# Patient Record
Sex: Male | Born: 1975
Health system: Southern US, Community
[De-identification: ages and names within clinical notes are randomized; demographics above are authoritative.]

## PROBLEM LIST (undated history)

## (undated) DIAGNOSIS — A86 Unspecified viral encephalitis: Secondary | ICD-10-CM

## (undated) DIAGNOSIS — Z87442 Personal history of urinary calculi: Secondary | ICD-10-CM

## (undated) DIAGNOSIS — B019 Varicella without complication: Secondary | ICD-10-CM

## (undated) DIAGNOSIS — N200 Calculus of kidney: Secondary | ICD-10-CM

## (undated) HISTORY — DX: Calculus of kidney: N20.0

## (undated) HISTORY — PX: WISDOM TOOTH EXTRACTION: SHX21

## (undated) HISTORY — PX: VASECTOMY: SHX75

## (undated) HISTORY — DX: Unspecified viral encephalitis: A86

## (undated) HISTORY — DX: Varicella without complication: B01.9

---

## 2009-02-13 ENCOUNTER — Encounter: Admission: RE | Admit: 2009-02-13 | Discharge: 2009-02-13 | Payer: Self-pay | Admitting: Internal Medicine

## 2009-11-20 ENCOUNTER — Emergency Department (HOSPITAL_COMMUNITY): Admission: EM | Admit: 2009-11-20 | Discharge: 2009-11-21 | Payer: Self-pay | Admitting: Emergency Medicine

## 2009-12-01 LAB — HM HIV SCREENING LAB: HM HIV SCREENING: NEGATIVE

## 2010-06-08 ENCOUNTER — Emergency Department: Payer: Self-pay | Admitting: Emergency Medicine

## 2010-06-14 ENCOUNTER — Ambulatory Visit: Payer: Self-pay | Admitting: Urology

## 2010-06-28 ENCOUNTER — Ambulatory Visit: Payer: Self-pay | Admitting: Urology

## 2010-07-26 ENCOUNTER — Ambulatory Visit: Payer: Self-pay | Admitting: Urology

## 2010-07-27 ENCOUNTER — Ambulatory Visit: Payer: Self-pay | Admitting: Urology

## 2010-10-16 LAB — CBC
Hemoglobin: 14.8 g/dL (ref 13.0–17.0)
RBC: 4.71 MIL/uL (ref 4.22–5.81)

## 2010-10-16 LAB — BASIC METABOLIC PANEL
BUN: 10 mg/dL (ref 6–23)
CO2: 29 mEq/L (ref 19–32)
Chloride: 104 mEq/L (ref 96–112)
GFR calc Af Amer: >60 mL/min (ref >60)
GFR calc non Af Amer: >60 mL/min (ref >60)
Glucose, Bld: 150 mg/dL — ABNORMAL HIGH (ref 70–99)
Potassium: 3.2 mEq/L — ABNORMAL LOW (ref 3.5–5.1)
Sodium: 140 mEq/L (ref 135–145)

## 2010-10-16 LAB — RAPID URINE DRUG SCREEN, HOSP PERFORMED
Barbiturates: NOT DETECTED
Cocaine: NOT DETECTED
Opiates: NOT DETECTED

## 2010-10-16 LAB — DIFFERENTIAL
Basophils Absolute: 0.1 10*3/uL (ref 0.0–0.1)
Basophils Relative: 1 % (ref 0–1)
Eosinophils Absolute: 0 10*3/uL (ref 0.0–0.7)
Eosinophils Relative: 0 % (ref 0–5)
Lymphocytes Relative: 13 % (ref 12–46)
Lymphs Abs: 1.5 10*3/uL (ref 0.7–4.0)
Monocytes Absolute: 0.5 10*3/uL (ref 0.1–1.0)
Monocytes Relative: 4 % (ref 3–12)
Neutrophils Relative %: 82 % — ABNORMAL HIGH (ref 43–77)

## 2010-10-16 LAB — GLUCOSE, CAPILLARY: Glucose-Capillary: 138 mg/dL — ABNORMAL HIGH (ref 70–99)

## 2010-10-16 LAB — URINALYSIS, ROUTINE W REFLEX MICROSCOPIC
Ketones, ur: 40 mg/dL — AB
Nitrite: NEGATIVE
Urobilinogen, UA: 1 mg/dL (ref 0.0–1.0)

## 2010-10-16 LAB — CK TOTAL AND CKMB (NOT AT ARMC): CK, MB: 1.4 ng/mL (ref 0.3–4.0)

## 2010-10-16 LAB — TROPONIN I: Troponin I: 0.01 ng/mL (ref 0.00–0.06)

## 2011-07-30 HISTORY — PX: EXTRACORPOREAL SHOCK WAVE LITHOTRIPSY: SHX1557

## 2011-08-23 IMAGING — CR DG ABDOMEN 1V
1 series · 2 of 2 positions shown · non-contrast
Comparison: none

REASON FOR EXAM: Calculus
COMMENTS:

[Series 1: view not recorded · 0.17mm/px · 2 of 2 slices shown]
[im 1/2]
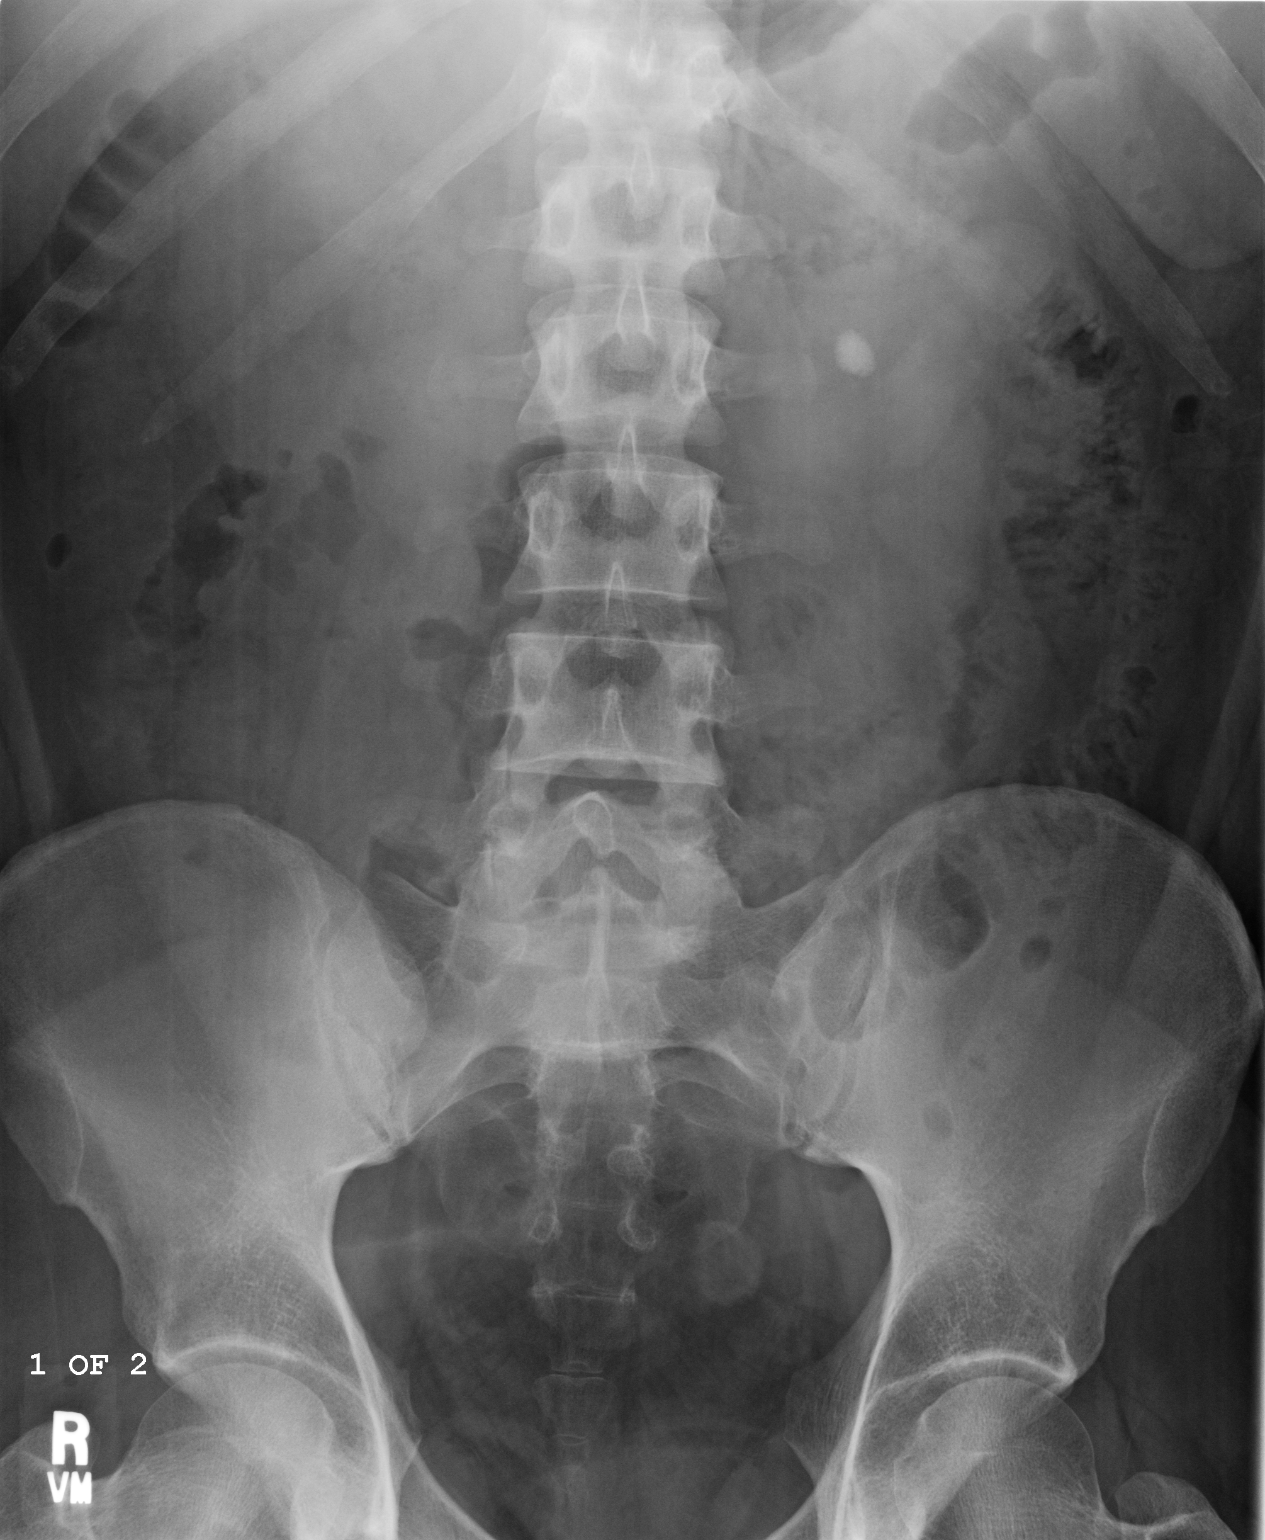
[im 2/2]
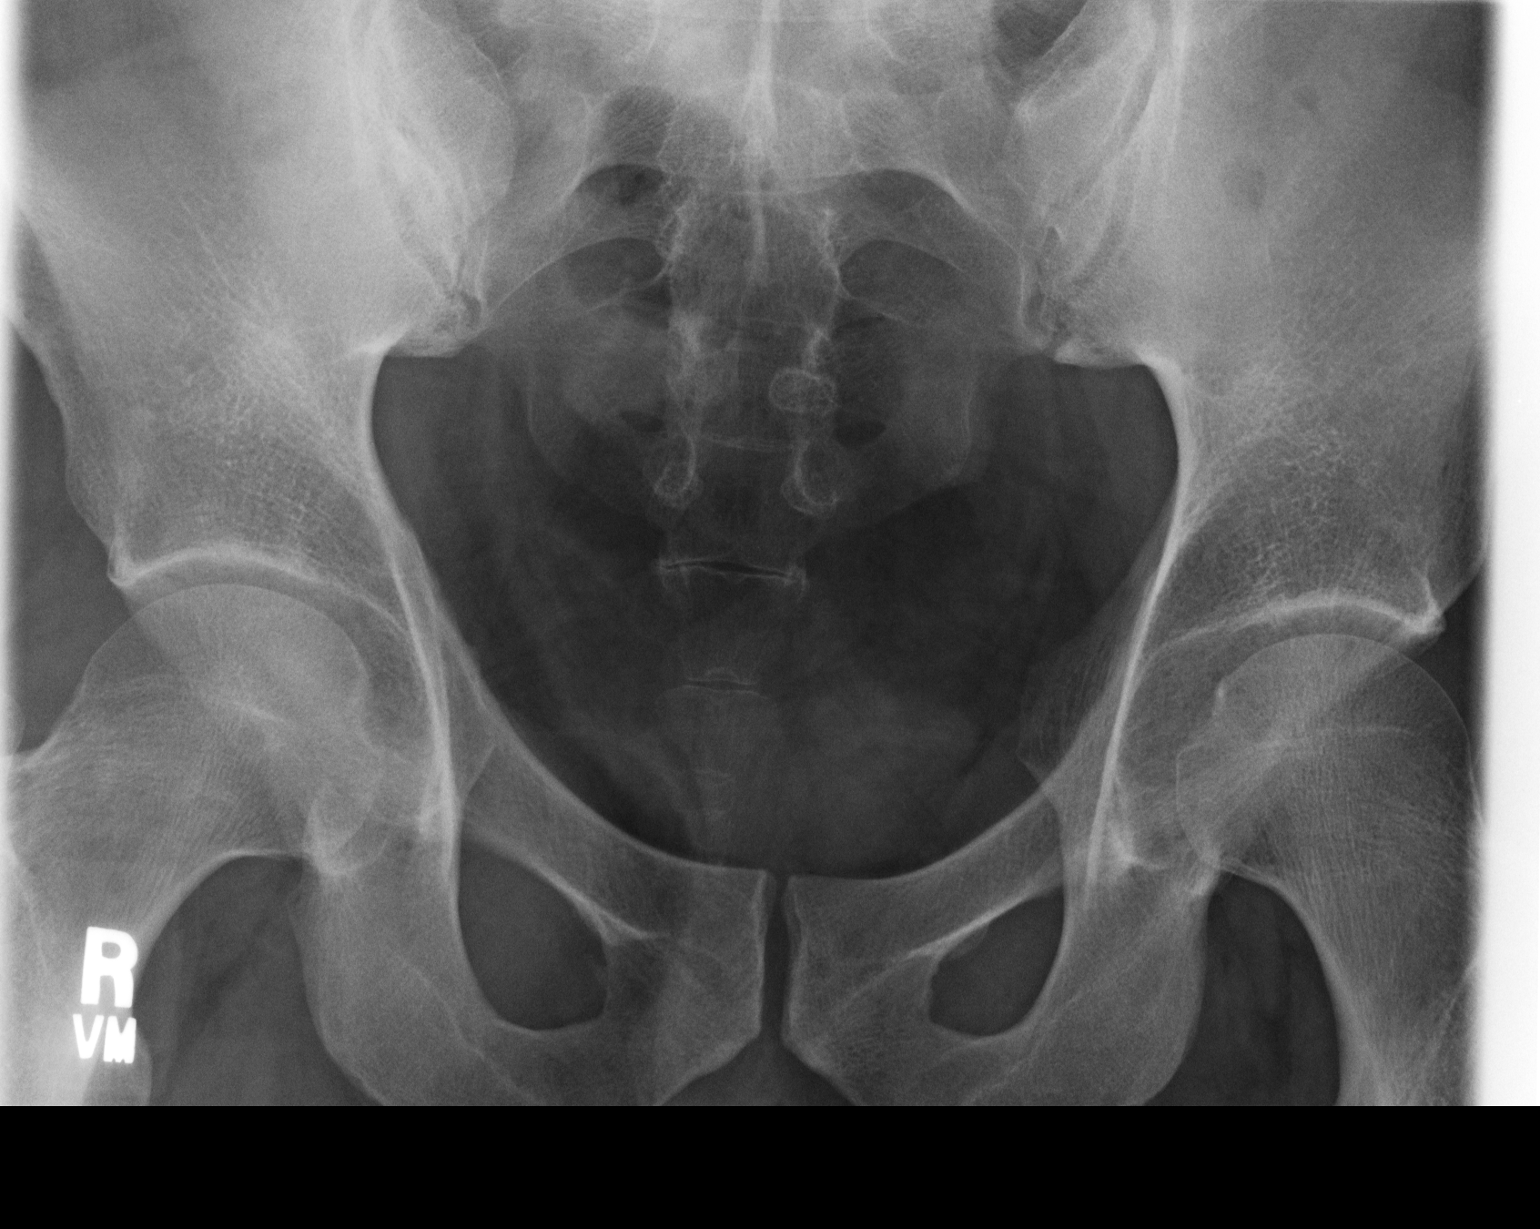

[2 of 2 positions shown; findings below may reference images not displayed]

PROCEDURE:     DXR - DXR KIDNEY URETER BLADDER  - June 28, 2010  [DATE]

RESULT:     Comparison is made to the study of 06/14/2010. There is a large
calcific density in the region of the left kidney measuring 12 mm which is
similar to the previous exam. This is at the L2 level over the region of the
left renal pelvis. No definite right nephrolithiasis is evident. No definite
distal ureteral calculi are evident. The bowel gas pattern is unremarkable.
IMPRESSION: Left nephrolithiasis.

## 2014-01-12 ENCOUNTER — Emergency Department: Payer: Self-pay | Admitting: Emergency Medicine

## 2014-01-12 LAB — URINALYSIS, COMPLETE
Bilirubin,UR: NEGATIVE
Glucose,UR: NEGATIVE mg/dL (ref 0–75)
KETONE: NEGATIVE
Leukocyte Esterase: NEGATIVE
NITRITE: NEGATIVE
Ph: 5 (ref 4.5–8.0)
RBC,UR: 49 /HPF (ref 0–5)
SPECIFIC GRAVITY: 1.024 (ref 1.003–1.030)
SQUAMOUS EPITHELIAL: NONE SEEN
WBC UR: 1 /HPF (ref 0–5)

## 2014-01-12 LAB — BASIC METABOLIC PANEL
Anion Gap: 8 (ref 7–16)
BUN: 8 mg/dL (ref 7–18)
CALCIUM: 9.2 mg/dL (ref 8.5–10.1)
CREATININE: 1.34 mg/dL — AB (ref 0.60–1.30)
Chloride: 105 mmol/L (ref 98–107)
Co2: 26 mmol/L (ref 21–32)
EGFR (African American): >60
EGFR (Non-African Amer.): >60
GLUCOSE: 133 mg/dL — AB (ref 65–99)
OSMOLALITY: 278 (ref 275–301)
POTASSIUM: 3.9 mmol/L (ref 3.5–5.1)
Sodium: 139 mmol/L (ref 136–145)

## 2015-05-26 ENCOUNTER — Ambulatory Visit: Payer: Self-pay

## 2015-06-02 ENCOUNTER — Ambulatory Visit (INDEPENDENT_AMBULATORY_CARE_PROVIDER_SITE_OTHER): Payer: 59 | Admitting: Urology

## 2015-06-02 ENCOUNTER — Encounter: Payer: Self-pay | Admitting: Urology

## 2015-06-02 VITALS — BP 146/80 | HR 68 | Ht 72.0 in | Wt 235.1 lb

## 2015-06-02 DIAGNOSIS — Z3009 Encounter for other general counseling and advice on contraception: Secondary | ICD-10-CM | POA: Diagnosis not present

## 2015-06-02 MED ORDER — DIAZEPAM 10 MG PO TABS: 10.0000 mg | ORAL_TABLET | ORAL | Status: DC

## 2015-06-02 NOTE — Progress Notes (Signed)
06/02/2015 10:21 AM   Adam Barron 1976/01/25 [id number]  Referring provider: No referring provider defined for this encounter.  Chief Complaint  Patient presents with  . VAS Consult    New Patient    HPI: The patient is a 39 year old male who presents to the office his wife today to discuss a vasectomy. He has 3 children at this time. He and his wife desire no further children.   PMH: Past Medical History  Diagnosis Date  . Nephrolithiasis     Surgical History: Past Surgical History  Procedure Laterality Date  . Extracorporeal shock wave lithotripsy  2013    Home Medications:    Medication List       This list is accurate as of: 06/02/15 10:21 AM.  Always use your most recent med list.               diazepam 10 MG tablet  Commonly known as:  VALIUM  Take 1 tablet (10 mg total) by mouth as directed.        Allergies:  Allergies  Allergen Reactions  . Penicillin G Rash    Family History: Family History  Problem Relation Age of Onset  . Bladder Cancer Neg Hx   . Prostate cancer Neg Hx   . Kidney Stones Father     Social History:  reports that he has quit smoking. He does not have any smokeless tobacco history on file. He reports that he drinks alcohol. He reports that he does not use illicit drugs.  ROS: UROLOGY Frequent Urination?: No Hard to postpone urination?: No Burning/pain with urination?: No Get up at night to urinate?: No Leakage of urine?: No Urine stream starts and stops?: No Trouble starting stream?: No Do you have to strain to urinate?: No Blood in urine?: No Urinary tract infection?: No Sexually transmitted disease?: No Injury to kidneys or bladder?: No Painful intercourse?: No Weak stream?: No Erection problems?: No Penile pain?: No  Gastrointestinal Nausea?: No Vomiting?: No Indigestion/heartburn?: No Diarrhea?: No Constipation?: No  Constitutional Fever: No Night sweats?: No Weight loss?: No Fatigue?:  No  Skin Skin rash/lesions?: No Itching?: No  Eyes Blurred vision?: No Double vision?: No  Ears/Nose/Throat Sore throat?: No Sinus problems?: No  Hematologic/Lymphatic Swollen glands?: No Easy bruising?: No  Cardiovascular Leg swelling?: No Chest pain?: No  Respiratory Cough?: No Shortness of breath?: No  Endocrine Excessive thirst?: No  Musculoskeletal Back pain?: No Joint pain?: No  Neurological Headaches?: No Dizziness?: No  Psychologic Depression?: No Anxiety?: No  Physical Exam: BP 146/80 mmHg  Pulse 68  Ht 6' (1.829 m)  Wt 235 lb 1.6 oz (106.641 kg)  BMI 31.88 kg/m2  Constitutional:  Alert and oriented, No acute distress. HEENT: [place] AT, moist mucus membranes.  Trachea midline, no masses. Cardiovascular: No clubbing, cyanosis, or edema. Respiratory: Normal respiratory effort, no increased work of breathing. GI: Abdomen is soft, nontender, nondistended, no abdominal masses GU: No CVA tenderness. Normal phallus. Testicles and equal bilaterally. The vas is palpable bilaterally. Skin: No rashes, bruises or suspicious lesions. Lymph: No cervical or inguinal adenopathy. Neurologic: Grossly intact, no focal deficits, moving all 4 extremities. Psychiatric: Normal mood and affect.  Laboratory Data: Lab Results  Component Value Date   WBC 11.8* 11/20/2009   HGB 14.8 11/20/2009   HCT 42.9 11/20/2009   MCV 91.1 11/20/2009   PLT 223 11/20/2009    Lab Results  Component Value Date   CREATININE 1.34* 01/12/2014    No results  found for: PSA  No results found for: TESTOSTERONE  No results found for: HGBA1C  Urinalysis    Component Value Date/Time   COLORURINE Yellow 01/12/2014 0902   COLORURINE AMBER BIOCHEMICALS MAY BE AFFECTED BY COLOR* 11/20/2009 2053   APPEARANCEUR Hazy 01/12/2014 0902   APPEARANCEUR TURBID* 11/20/2009 2053   LABSPEC 1.024 01/12/2014 0902   LABSPEC 1.027 11/20/2009 2053   PHURINE 5.0 01/12/2014 0902   PHURINE 6.0  11/20/2009 2053   GLUCOSEU Negative 01/12/2014 0902   GLUCOSEU NEGATIVE 11/20/2009 2053   HGBUR 2+ 01/12/2014 0902   HGBUR LARGE* 11/20/2009 2053   BILIRUBINUR Negative 01/12/2014 0902   BILIRUBINUR SMALL* 11/20/2009 2053   KETONESUR Negative 01/12/2014 0902   KETONESUR 40* 11/20/2009 2053   PROTEINUR 30 mg/dL 16/10/960406/17/2015 [id number]   PROTEINUR 100* 11/20/2009 2053   UROBILINOGEN 1.0 11/20/2009 2053   NITRITE Negative 01/12/2014 0902   NITRITE NEGATIVE 11/20/2009 2053   LEUKOCYTESUR Negative 01/12/2014 0902   LEUKOCYTESUR SMALL* 11/20/2009 2053      Assessment & Plan:    Today, we discussed what the vas deferens is, where it is located, and its function. We reviewed the procedure for vasectomy, it's risks, benefits, alternatives, and likelihood of achieving his goals. We discussed in detail the procedure, complications, and recovery as well as the need for clearance prior to unprotected intercourse. We discussed that this procedure does not result in immediate sterility and that they would need to use other forms of birth control until he has been cleared with negative postvasectomy semen analyses. I explained that the procedure is considered to be permanent and that attempts at reversal have varying degrees of success. These options include vasectomy reversal, sperm retrieval, and in vitro fertilization; these can be very expensive. We discussed the chance of postvasectomy pain syndrome which occurs in less than 5% of patients. I explained to the patient that there is no treatment to resolve this chronic pain, and that if it developed I would not be able to help resolve the issue, but that surgery is generally not needed for correction. I explained there have even been reports of systemic like illness associated with this chronic pain, and that there was no good cure. I explained that vasectomy it is not a 100% reliable form of birth control, and the risk of pregnancy after vasectomy is approximately  1 in 2000 men who had a negative postvasectomy semen analysis or rare non-motile sperm. I explained that repeat vasectomy was necessary in less than 1% of vasectomy procedures when employing the type of technique that I use. I explained that he should refrain from ejaculation for approximately one week following vasectomy. I explained that there are other options for birth control which are permanent and non-permanent; we discussed these. I explained the rates of surgical complications, such as symptomatic hematoma or infection, are low (1-2%) and vary with the surgeon's experience and criteria used to diagnose the complication.  The patient had the opportunity to ask questions to his stated satisfaction. He voiced understanding of the above factors and stated that he has read all the information provided to him and the packets and informed consent   1. Family planning -vasectomy   Return for Vasectomy.  Hildred LaserBrian James Gloria Lambertson, MD  [facility] [address], [address] [place], [place] [address] [phone]

## 2015-06-29 ENCOUNTER — Ambulatory Visit (INDEPENDENT_AMBULATORY_CARE_PROVIDER_SITE_OTHER): Payer: 59 | Admitting: Urology

## 2015-06-29 ENCOUNTER — Encounter: Payer: Self-pay | Admitting: Urology

## 2015-06-29 VITALS — BP 143/83 | HR 65 | Ht 72.0 in | Wt 232.6 lb

## 2015-06-29 DIAGNOSIS — Z3009 Encounter for other general counseling and advice on contraception: Secondary | ICD-10-CM | POA: Diagnosis not present

## 2015-06-29 NOTE — Progress Notes (Signed)
Bilateral Vasectomy Procedure  Pre-Procedure: - Patient's scrotum was prepped and draped for vasectomy. - The vas was palpated through the scrotal skin on the left. - 1% Xylocaine was injected into the skin and surrounding tissue for placement  - In a similar manner, the vas on the right was identified, anesthetized, and stabilized.  Procedure: - A scalpel was used to make a 1 cm incision in the left hemiscrotum - The left vas was isolated and brought up through the incision exposing that structure. - Bleeding points were cauterized as they occurred. - The vas was free from the surrounding structures and brought to the view. - A segment was positioned for placement with a hemostat. - A second hemostat was placed and a small segment between the two hemostats and was removed for inspection. - Each end of the transected vas lumen was fulgurated/ obliterated using needlepoint electrocautery - Each end of the last was tied off with a interrupted 3-0 silk. -The same procedure was performed on the right. - A single suture of #3-0 chromic catgut was used to close each lateral scrotal skin incision - A dressing was applied.  Post-Procedure: - Patient was instructed in care of the operative area - A specimen is to be delivered in 12 weeks   -Another form of contraception is to be used until

## 2015-07-05 ENCOUNTER — Telehealth: Payer: Self-pay

## 2015-07-05 NOTE — Telephone Encounter (Signed)
Pt called c/o pain and increased swelling of testicles. Pt stated he has been doing some heavy lifting over the weekend and into Monday. Pt denied n/v, f/c, site drainage, increased temperature at site, or dehiscence. Offered pt for provider to examine site and pt declined. Reinforced with pt to rest, apply ice, and monitor swelling/redness. Pt voiced understanding.

## 2015-08-16 ENCOUNTER — Ambulatory Visit (INDEPENDENT_AMBULATORY_CARE_PROVIDER_SITE_OTHER): Payer: 59 | Admitting: Obstetrics and Gynecology

## 2015-08-16 ENCOUNTER — Encounter: Payer: Self-pay | Admitting: Obstetrics and Gynecology

## 2015-08-16 VITALS — BP 151/90 | HR 86 | Resp 16 | Ht 72.0 in | Wt 236.2 lb

## 2015-08-16 DIAGNOSIS — N5082 Scrotal pain: Secondary | ICD-10-CM | POA: Diagnosis not present

## 2015-08-16 NOTE — Progress Notes (Signed)
08/16/2015 2:48 PM   [name] January 17, 1976 [id number]  Referring provider: No referring provider defined for this encounter.  Chief Complaint  Patient presents with  . Post-op Problem    vasectomy 06/29/15  . Testicle Pain    HPI: Patient is a 40 year old male status post vasectomy 6 weeks ago by Dr. Sherryl Barters. He presents today with complaints of new onset of scrotal swelling occurring after he felt a popping sensation 4 days ago in the right side of his scrotum. He reports that swelling was significant for few days and has since began to improve. He denies any fevers or drainage from scrotum.   PMH: Past Medical History  Diagnosis Date  . Nephrolithiasis     Surgical History: Past Surgical History  Procedure Laterality Date  . Extracorporeal shock wave lithotripsy  2013    Home Medications:    Medication List    Notice  As of 08/16/2015  2:48 PM   You have not been prescribed any medications.      Allergies:  Allergies  Allergen Reactions  . Penicillin G Rash    Family History: Family History  Problem Relation Age of Onset  . Bladder Cancer Neg Hx   . Prostate cancer Neg Hx   . Kidney Stones Father     Social History:  reports that he has quit smoking. He does not have any smokeless tobacco history on file. He reports that he drinks alcohol. He reports that he does not use illicit drugs.  ROS: UROLOGY Frequent Urination?: No Hard to postpone urination?: No Burning/pain with urination?: No Get up at night to urinate?: No Leakage of urine?: No Urine stream starts and stops?: No Trouble starting stream?: No Do you have to strain to urinate?: No Blood in urine?: No Urinary tract infection?: No Sexually transmitted disease?: No Injury to kidneys or bladder?: No Painful intercourse?: No Weak stream?: No Erection problems?: No Penile pain?: No  Gastrointestinal Nausea?: No Vomiting?: No Indigestion/heartburn?: No Diarrhea?: No Constipation?:  No  Constitutional Fever: No Night sweats?: No Weight loss?: No Fatigue?: No  Skin Skin rash/lesions?: No Itching?: No  Eyes Blurred vision?: No Double vision?: No  Ears/Nose/Throat Sore throat?: No Sinus problems?: No  Hematologic/Lymphatic Swollen glands?: No Easy bruising?: No  Cardiovascular Leg swelling?: No Chest pain?: No  Respiratory Cough?: No Shortness of breath?: No  Endocrine Excessive thirst?: No  Musculoskeletal Back pain?: No Joint pain?: No  Neurological Headaches?: No Dizziness?: No  Psychologic Depression?: No Anxiety?: No  Physical Exam: BP 151/90 mmHg  Pulse 86  Resp 16  Ht 6' (1.829 m)  Wt 236 lb 3.2 oz (107.14 kg)  BMI 32.03 kg/m2  Constitutional:  Alert and oriented, No acute distress. GU: Normal circumcised phallus, bilateral scrotal swelling right greater than left noted, palpable mobile fluctuant moderately tender area noted superior to this patient's right testicle, testicles bilaterally normal, no erythema or drainage Neurologic: Grossly intact, no focal deficits, moving all 4 extremities. Psychiatric: Normal mood and affect.  Laboratory Data:   Urinalysis    Component Value Date/Time   COLORURINE Yellow 01/12/2014 0902   COLORURINE AMBER BIOCHEMICALS MAY BE AFFECTED BY COLOR* 11/20/2009 2053   APPEARANCEUR Hazy 01/12/2014 0902   APPEARANCEUR TURBID* 11/20/2009 2053   LABSPEC 1.024 01/12/2014 0902   LABSPEC 1.027 11/20/2009 2053   PHURINE 5.0 01/12/2014 0902   PHURINE 6.0 11/20/2009 2053   GLUCOSEU Negative 01/12/2014 0902   GLUCOSEU NEGATIVE 11/20/2009 2053   HGBUR 2+ 01/12/2014 0902  HGBUR LARGE* 11/20/2009 2053   BILIRUBINUR Negative 01/12/2014 0902   BILIRUBINUR SMALL* 11/20/2009 2053   KETONESUR Negative 01/12/2014 0902   KETONESUR 40* 11/20/2009 2053   PROTEINUR 30 mg/dL 14/78/2956 2130   PROTEINUR 100* 11/20/2009 2053   UROBILINOGEN 1.0 11/20/2009 2053   NITRITE Negative 01/12/2014 0902   NITRITE  NEGATIVE 11/20/2009 2053   LEUKOCYTESUR Negative 01/12/2014 0902   LEUKOCYTESUR SMALL* 11/20/2009 2053    Pertinent Imaging:  Assessment & Plan:  1. Scrotal Pain- 6 weeks s/p vasectomy.  Patient was also seen and examined by Dr. Sherryl Barters today. Possible small vessel rupture resulting in hematoma formation. Since swelling is beginning to improve patient was reassured and he will follow-up in 3 weeks to ensure resolution of swelling and discomfort. If symptoms do not improve we will proceed with a scrotal ultrasound.   There are no diagnoses linked to this encounter.  Return in about 3 weeks (around 09/06/2015).  These notes generated with voice recognition software. I apologize for typographical errors.  Earlie Lou, FNP  [facility] [address] [place], [place] [address] [phone]

## 2015-09-06 ENCOUNTER — Ambulatory Visit: Payer: 59 | Admitting: Obstetrics and Gynecology

## 2016-11-15 ENCOUNTER — Emergency Department: Payer: 59

## 2016-11-15 ENCOUNTER — Emergency Department
Admission: EM | Admit: 2016-11-15 | Discharge: 2016-11-15 | Disposition: A | Discharge status: Home / Self Care | Payer: 59 | Attending: Emergency Medicine | Admitting: Emergency Medicine

## 2016-11-15 DIAGNOSIS — F17220 Nicotine dependence, chewing tobacco, uncomplicated: Secondary | ICD-10-CM | POA: Diagnosis not present

## 2016-11-15 DIAGNOSIS — R079 Chest pain, unspecified: Secondary | ICD-10-CM | POA: Diagnosis not present

## 2016-11-15 DIAGNOSIS — R0789 Other chest pain: Secondary | ICD-10-CM | POA: Insufficient documentation

## 2016-11-15 LAB — CBC
HEMATOCRIT: 44.1 % (ref 40.0–52.0)
HEMOGLOBIN: 15.6 g/dL (ref 13.0–18.0)
MCH: 30.2 pg (ref 26.0–34.0)
MCHC: 35.3 g/dL (ref 32.0–36.0)
MCV: 85.6 fL (ref 80.0–100.0)
Platelets: 208 10*3/uL (ref 150–440)
RBC: 5.15 MIL/uL (ref 4.40–5.90)
RDW: 13.1 % (ref 11.5–14.5)
WBC: 7.5 10*3/uL (ref 3.8–10.6)

## 2016-11-15 LAB — BASIC METABOLIC PANEL
ANION GAP: 6 (ref 5–15)
BUN: 9 mg/dL (ref 6–20)
CHLORIDE: 106 mmol/L (ref 101–111)
CO2: 26 mmol/L (ref 22–32)
Calcium: 9.1 mg/dL (ref 8.9–10.3)
Creatinine, Ser: 1.09 mg/dL (ref 0.61–1.24)
GFR calc Af Amer: >60 mL/min (ref >60)
GFR calc non Af Amer: >60 mL/min (ref >60)
GLUCOSE: 101 mg/dL — AB (ref 65–99)
POTASSIUM: 3.9 mmol/L (ref 3.5–5.1)
Sodium: 138 mmol/L (ref 135–145)

## 2016-11-15 LAB — TROPONIN I: Troponin I: <0.03 ng/mL (ref <0.03)

## 2016-11-15 NOTE — Discharge Instructions (Signed)
Please establish care care with a primary care physician within the next week for a recheck.  Return to the ED sooner for any new or worsening symptoms such as worsening chest pain, shortness of breath, or for any other concerns.  It was a pleasure to take care of you today, and thank you for coming to our emergency department.  If you have any questions or concerns before leaving please ask the nurse to grab me and I'm more than happy to go through your aftercare instructions again.  If you were prescribed any opioid pain medication today such as Norco, Vicodin, Percocet, morphine, hydrocodone, or oxycodone please make sure you do not drive when you are taking this medication as it can alter your ability to drive safely.  If you have any concerns once you are home that you are not improving or are in fact getting worse before you can make it to your follow-up appointment, please do not hesitate to call 911 and come back for further evaluation.  Merrily Brittle MD  Results for orders placed or performed during the hospital encounter of 11/15/16  Basic metabolic panel  Result Value Ref Range   Sodium 138 135 - 145 mmol/L   Potassium 3.9 3.5 - 5.1 mmol/L   Chloride 106 101 - 111 mmol/L   CO2 26 22 - 32 mmol/L   Glucose, Bld 101 (H) 65 - 99 mg/dL   BUN 9 6 - 20 mg/dL   Creatinine, Ser 1.61 0.61 - 1.24 mg/dL   Calcium 9.1 8.9 - 09.6 mg/dL   GFR calc non Af Amer >60 >60 mL/min   GFR calc Af Amer >60 >60 mL/min   Anion gap 6 5 - 15  CBC  Result Value Ref Range   WBC 7.5 3.8 - 10.6 K/uL   RBC 5.15 4.40 - 5.90 MIL/uL   Hemoglobin 15.6 13.0 - 18.0 g/dL   HCT 04.5 40.9 - 81.1 %   MCV 85.6 80.0 - 100.0 fL   MCH 30.2 26.0 - 34.0 pg   MCHC 35.3 32.0 - 36.0 g/dL   RDW 91.4 78.2 - 95.6 %   Platelets 208 150 - 440 K/uL  Troponin I  Result Value Ref Range   Troponin I <0.03 <0.03 ng/mL   Dg Chest 2 View  Result Date: 11/15/2016 CLINICAL DATA:  Acute dizziness and chest pain for 1 month EXAM:  CHEST  2 VIEW COMPARISON:  None available next healed FINDINGS: The heart size and mediastinal contours are within normal limits. Both lungs are clear. The visualized skeletal structures are unremarkable. IMPRESSION: No active cardiopulmonary disease. Electronically Signed   By: Judie Petit.  Shick M.D.   On: 11/15/2016 12:07

## 2016-11-15 NOTE — ED Provider Notes (Signed)
[facility] Emergency Department Provider Note  ____________________________________________   First MD Initiated Contact with Patient 11/15/16 1435     (approximate)  I have reviewed the triage vital signs and the nursing notes.   HISTORY  Chief Complaint Chest Pain    HPI Adam Barron is a 41 y.o. male who presents the emergency department with 1 month of intermittent sharp substernal nonradiating chest pain. It is nonexertional. He's had no shortness of breath.He comes to the emergency department today not for any change in his pain but because he had the day off and his wife insisted. He attempted to go into the walk-in clinic but because it was chest pain and advised him to come to the emergency department. He has no family history of early myocardial infarction. He denies fevers or chills. He denies cough. He denies leg swelling or immobilization. His pain is not ripping or tearing and does not go to his back.   Past Medical History:  Diagnosis Date  . Nephrolithiasis     There are no active problems to display for this patient.   Past Surgical History:  Procedure Laterality Date  . EXTRACORPOREAL SHOCK WAVE LITHOTRIPSY  2013    Prior to Admission medications   Not on File    Allergies Penicillin g  Family History  Problem Relation Age of Onset  . Kidney Stones Father   . Bladder Cancer Neg Hx   . Prostate cancer Neg Hx     Social History Social History  Substance Use Topics  . Smoking status: Former Games developer  . Smokeless tobacco: Current User    Types: Chew  . Alcohol use 0.0 oz/week     Comment: socially    Review of Systems Constitutional: No fever/chills Eyes: No visual changes. ENT: No sore throat. Cardiovascular: Positive chest pain. Respiratory: Denies shortness of breath. Gastrointestinal: No abdominal pain.  No nausea, no vomiting.  No diarrhea.  No constipation. Genitourinary: Negative for  dysuria. Musculoskeletal: Negative for back pain. Skin: Negative for rash. Neurological: Negative for headaches, focal weakness or numbness.  10-point ROS otherwise negative.  ____________________________________________   PHYSICAL EXAM:  VITAL SIGNS: ED Triage Vitals  Enc Vitals Group     BP 11/15/16 1134 132/87     Pulse Rate 11/15/16 1134 67     Resp 11/15/16 1134 18     Temp 11/15/16 1134 98.9 F (37.2 C)     Temp Source 11/15/16 1134 Oral     SpO2 11/15/16 1134 98 %     Weight 11/15/16 1135 235 lb (106.6 kg)     Height 11/15/16 1135 6' (1.829 m)     Head Circumference --      Peak Flow --      Pain Score --      Pain Loc --      Pain Edu? --      Excl. in GC? --     Constitutional: Alert and oriented x 4 well appearing nontoxic no diaphoresis speaks in full, clear sentences Eyes: PERRL EOMI. Head: Atraumatic. Nose: No congestion/rhinnorhea. Mouth/Throat: No trismus Neck: No stridor.   Cardiovascular: Normal rate, regular rhythm. Grossly normal heart sounds.  Good peripheral circulation. Respiratory: Normal respiratory effort.  No retractions. Lungs CTAB and moving good air Gastrointestinal: Soft nondistended nontender  Musculoskeletal: No lower extremity edema  Legs are equal in size Neurologic:  Normal speech and language. No gross focal neurologic deficits are appreciated. Skin:  Skin is warm, dry and  intact. No rash noted. Psychiatric: Mood and affect are normal. Speech and behavior are normal.    ______________________________   LABS (all labs ordered are listed, but only abnormal results are displayed)  Labs Reviewed  BASIC METABOLIC PANEL - Abnormal; Notable for the following:       Result Value   Glucose, Bld 101 (*)    All other components within normal limits  CBC  TROPONIN I    No signs of acute ischemia __________________________________________  EKG  ED ECG REPORT I, Merrily Brittle, the attending physician, personally viewed and  interpreted this ECG.  Date: 11/15/2016 Rate: 86 Rhythm: normal sinus rhythm QRS Axis: normal Intervals: normal ST/T Wave abnormalities: normal Conduction Disturbances: none Narrative Interpretation: unremarkable  ____________________________________________  RADIOLOGY  Chest x-ray with no acute disease ____________________________________________   PROCEDURES  Procedure(s) performed: no  Procedures  Critical Care performed: no  ____________________________________________   INITIAL IMPRESSION / ASSESSMENT AND PLAN / ED COURSE  Pertinent labs & imaging results that were available during my care of the patient were reviewed by me and considered in my medical decision making (see chart for details).  The patient arrives atypical chest pain and a heart score of 1. His EKG is unremarkable. His pain is nonexertional. His chest x-ray is unremarkable. At this point he is medically stable for outpatient management with reestablishing care with primary care.      ____________________________________________   FINAL CLINICAL IMPRESSION(S) / ED DIAGNOSES  Final diagnoses:  Atypical chest pain      NEW MEDICATIONS STARTED DURING THIS VISIT:  There are no discharge medications for this patient.    Note:  This document was prepared using Dragon voice recognition software and may include unintentional dictation errors.     Merrily Brittle, MD 11/16/16 1200

## 2016-11-15 NOTE — ED Triage Notes (Signed)
Pt c/o intermittent chest tightness for the past month, states  Yesterday the pain radiated into the left arm and neck.. Denies SOB or diaphoresis with the episodes..states it occurs at rest.

## 2018-02-03 ENCOUNTER — Ambulatory Visit: Payer: 59 | Admitting: Internal Medicine

## 2018-02-03 ENCOUNTER — Encounter: Payer: Self-pay | Admitting: Internal Medicine

## 2018-02-03 VITALS — BP 138/80 | HR 72 | Temp 98.4°F | Ht 72.0 in | Wt 238.4 lb

## 2018-02-03 DIAGNOSIS — E669 Obesity, unspecified: Secondary | ICD-10-CM

## 2018-02-03 DIAGNOSIS — Z86018 Personal history of other benign neoplasm: Secondary | ICD-10-CM

## 2018-02-03 DIAGNOSIS — Z0000 Encounter for general adult medical examination without abnormal findings: Secondary | ICD-10-CM

## 2018-02-03 DIAGNOSIS — R739 Hyperglycemia, unspecified: Secondary | ICD-10-CM

## 2018-02-03 DIAGNOSIS — Z1283 Encounter for screening for malignant neoplasm of skin: Secondary | ICD-10-CM | POA: Diagnosis not present

## 2018-02-03 DIAGNOSIS — E559 Vitamin D deficiency, unspecified: Secondary | ICD-10-CM

## 2018-02-03 DIAGNOSIS — Z13818 Encounter for screening for other digestive system disorders: Secondary | ICD-10-CM

## 2018-02-03 DIAGNOSIS — Z1389 Encounter for screening for other disorder: Secondary | ICD-10-CM

## 2018-02-03 DIAGNOSIS — Z0184 Encounter for antibody response examination: Secondary | ICD-10-CM

## 2018-02-03 DIAGNOSIS — Z1159 Encounter for screening for other viral diseases: Secondary | ICD-10-CM

## 2018-02-03 DIAGNOSIS — Z1329 Encounter for screening for other suspected endocrine disorder: Secondary | ICD-10-CM

## 2018-02-03 DIAGNOSIS — Z13220 Encounter for screening for lipoid disorders: Secondary | ICD-10-CM

## 2018-02-03 NOTE — Progress Notes (Signed)
Pre visit review using our clinic review tool, if applicable. No additional management support is needed unless otherwise documented below in the visit note. 

## 2018-02-03 NOTE — Progress Notes (Signed)
Chief Complaint  Patient presents with  . Establish Care   New patient w/o complaints wants primary doctor    Review of Systems  Constitutional: Negative for weight loss.  HENT: Negative for hearing loss.   Eyes: Negative for blurred vision.  Respiratory: Negative for shortness of breath.   Cardiovascular: Negative for chest pain.  Gastrointestinal: Negative for abdominal pain.  Skin: Negative for rash.  Neurological: Negative for headaches.  Psychiatric/Behavioral: Negative for depression.   Past Medical History:  Diagnosis Date  . Nephrolithiasis    Past Surgical History:  Procedure Laterality Date  . EXTRACORPOREAL SHOCK WAVE LITHOTRIPSY  2013   Family History  Problem Relation Age of Onset  . Kidney Stones Father   . Bladder Cancer Neg Hx   . Prostate cancer Neg Hx    Social History   Socioeconomic History  . Marital status: Married    Spouse name: Not on file  . Number of children: Not on file  . Years of education: Not on file  . Highest education level: Not on file  Occupational History  . Not on file  Social Needs  . Financial resource strain: Not on file  . Food insecurity:    Worry: Not on file    Inability: Not on file  . Transportation needs:    Medical: Not on file    Non-medical: Not on file  Tobacco Use  . Smoking status: Former Games developer  . Smokeless tobacco: Current User    Types: Chew  Substance and Sexual Activity  . Alcohol use: Yes    Alcohol/week: 0.0 oz    Comment: socially  . Drug use: No  . Sexual activity: Not on file  Lifestyle  . Physical activity:    Days per week: Not on file    Minutes per session: Not on file  . Stress: Not on file  Relationships  . Social connections:    Talks on phone: Not on file    Gets together: Not on file    Attends religious service: Not on file    Active member of club or organization: Not on file    Attends meetings of clubs or organizations: Not on file    Relationship status: Not on file   . Intimate partner violence:    Fear of current or ex partner: Not on file    Emotionally abused: Not on file    Physically abused: Not on file    Forced sexual activity: Not on file  Other Topics Concern  . Not on file  Social History Narrative  . Not on file   No outpatient medications have been marked as taking for the 02/03/18 encounter (Office Visit) with McLean-Scocuzza, Pasty Spillers, MD.   Allergies  Allergen Reactions  . Penicillin G Rash   No results found for this or any previous visit (from the past 2160 hour(s)). Objective  Body mass index is 32.33 kg/m. Wt Readings from Last 3 Encounters:  02/03/18 238 lb 6.4 oz (108.1 kg)  11/15/16 235 lb (106.6 kg)  08/16/15 236 lb 3.2 oz (107.1 kg)   Temp Readings from Last 3 Encounters:  02/03/18 98.4 F (36.9 C) (Oral)  11/15/16 98.9 F (37.2 C) (Oral)   BP Readings from Last 3 Encounters:  02/03/18 138/80  11/15/16 127/87  08/16/15 (!) 151/90   Pulse Readings from Last 3 Encounters:  02/03/18 72  11/15/16 (!) 56  08/16/15 86    Physical Exam  Constitutional: He is oriented to person,  place, and time. Vital signs are normal. He appears well-developed and well-nourished. He is cooperative.  HENT:  Head: Normocephalic and atraumatic.  Mouth/Throat: Oropharynx is clear and moist and mucous membranes are normal.  Eyes: Pupils are equal, round, and reactive to light. Conjunctivae are normal.  Cardiovascular: Normal rate, regular rhythm and normal heart sounds.  Pulmonary/Chest: Effort normal and breath sounds normal.  Neurological: He is alert and oriented to person, place, and time. Gait normal.  Skin: Skin is warm, dry and intact.  Psychiatric: He has a normal mood and affect. His speech is normal and behavior is normal. Judgment and thought content normal. Cognition and memory are normal.  Nursing note and vitals reviewed.   Assessment   1. Obesity BMI>32  2. H/o dysplastic nevi  3. HM Plan   1. rec healthy diet  choices and exercise  2. Refer dermatology tbse  3.  Ask about flu at f/u  Tdap utd  sch fasting labs  HIV neg 12/07/09  Refer to dermatology tbse   Former smoker now does chewing tobacco rec cessation  [facility]  Provider: Dr. French Anaracy McLean-Scocuzza-Internal Medicine

## 2018-02-03 NOTE — Patient Instructions (Addendum)
Please sch fasting labs w/in the next 1-2 weeks  F/u in 3 months  I referred you to dermatology as well    Cholesterol Cholesterol is a white, waxy, fat-like substance that is needed by the human body in small amounts. The liver makes all the cholesterol we need. Cholesterol is carried from the liver by the blood through the blood vessels. Deposits of cholesterol (plaques) may build up on blood vessel (artery) walls. Plaques make the arteries narrower and stiffer. Cholesterol plaques increase the risk for heart attack and stroke. You cannot feel your cholesterol level even if it is very high. The only way to know that it is high is to have a blood test. Once you know your cholesterol levels, you should keep a record of the test results. Work with your health care provider to keep your levels in the desired range. What do the results mean?  Total cholesterol is a rough measure of all the cholesterol in your blood.  LDL (low-density lipoprotein) is the "bad" cholesterol. This is the type that causes plaque to build up on the artery walls. You want this level to be low.  HDL (high-density lipoprotein) is the "good" cholesterol because it cleans the arteries and carries the LDL away. You want this level to be high.  Triglycerides are fat that the body can either burn for energy or store. High levels are closely linked to heart disease. What are the desired levels of cholesterol?  Total cholesterol below 200.  LDL below 100 for people who are at risk, below 70 for people at very high risk.  HDL above 40 is good. A level of 60 or higher is considered to be protective against heart disease.  Triglycerides below 150. How can I lower my cholesterol? Diet Follow your diet program as told by your health care provider.  Choose fish or white meat chicken and [place], roasted or baked. Limit fatty cuts of red meat, fried foods, and processed meats, such as sausage and lunch meats.  Eat lots of fresh  fruits and vegetables.  Choose whole grains, beans, pasta, potatoes, and cereals.  Choose olive oil, corn oil, or canola oil, and use only small amounts.  Avoid butter, mayonnaise, shortening, or palm kernel oils.  Avoid foods with trans fats.  Drink skim or nonfat milk and eat low-fat or nonfat yogurt and cheeses. Avoid whole milk, cream, ice cream, egg yolks, and full-fat cheeses.  Healthier desserts include angel food cake, ginger snaps, animal crackers, hard candy, popsicles, and low-fat or nonfat frozen yogurt. Avoid pastries, cakes, pies, and cookies.  Exercise  Follow your exercise program as told by your health care provider. A regular program: ? Helps to decrease LDL and raise HDL. ? Helps with weight control.  Do things that increase your activity level, such as gardening, walking, and taking the stairs.  Ask your health care provider about ways that you can be more active in your daily life.  Medicine  Take over-the-counter and prescription medicines only as told by your health care provider. ? Medicine may be prescribed by your health care provider to help lower cholesterol and decrease the risk for heart disease. This is usually done if diet and exercise have failed to bring down cholesterol levels. ? If you have several risk factors, you may need medicine even if your levels are normal.  This information is not intended to replace advice given to you by your health care provider. Make sure you discuss any questions you  have with your health care provider. Document Released: 04/09/2001 Document Revised: 02/10/2016 Document Reviewed: 01/13/2016 Elsevier Interactive Patient Education  2018 [employer]   Exercising to [employer]ose Weight Exercising can help you to lose weight. In order to lose weight through exercise, you need to do vigorous-intensity exercise. You can tell that you are exercising with vigorous intensity if you are breathing very hard and fast and cannot hold  a conversation while exercising. Moderate-intensity exercise helps to maintain your current weight. You can tell that you are exercising at a moderate level if you have a higher heart rate and faster breathing, but you are still able to hold a conversation. How often should I exercise? Choose an activity that you enjoy and set realistic goals. Your health care provider can help you to make an activity plan that works for you. Exercise regularly as directed by your health care provider. This may include:  Doing resistance training twice each week, such as: ? Push-ups. ? Sit-ups. ? Lifting weights. ? Using resistance bands.  Doing a given intensity of exercise for a given amount of time. Choose from these options: ? 150 minutes of moderate-intensity exercise every week. ? 75 minutes of vigorous-intensity exercise every week. ? A mix of moderate-intensity and vigorous-intensity exercise every week.  Children, pregnant women, people who are out of shape, people who are overweight, and older adults may need to consult a health care provider for individual recommendations. If you have any sort of medical condition, be sure to consult your health care provider before starting a new exercise program. What are some activities that can help me to lose weight?  Walking at a rate of at least 4.5 miles an hour.  Jogging or running at a rate of 5 miles per hour.  Biking at a rate of at least 10 miles per hour.  Lap swimming.  Roller-skating or in-line skating.  Cross-country skiing.  Vigorous competitive sports, such as football, basketball, and soccer.  Jumping rope.  Aerobic dancing. How can I be more active in my day-to-day activities?  Use the stairs instead of the elevator.  Take a walk during your lunch break.  If you drive, park your car farther away from work or school.  If you take public transportation, get off one stop early and walk the rest of the way.  Make all of your  phone calls while standing up and walking around.  Get up, stretch, and walk around every 30 minutes throughout the day. What guidelines should I follow while exercising?  Do not exercise so much that you hurt yourself, feel dizzy, or get very short of breath.  Consult your health care provider prior to starting a new exercise program.  Wear comfortable clothes and shoes with good support.  Drink plenty of water while you exercise to prevent dehydration or heat stroke. Body water is lost during exercise and must be replaced.  Work out until you breathe faster and your heart beats faster. This information is not intended to replace advice given to you by your health care provider. Make sure you discuss any questions you have with your health care provider. Document Released: 08/17/2010 Document Revised: 12/21/2015 Document Reviewed: 12/16/2013 Elsevier Interactive Patient Education  [employer]2018 Elsevier Inc.

## 2018-02-10 ENCOUNTER — Other Ambulatory Visit (INDEPENDENT_AMBULATORY_CARE_PROVIDER_SITE_OTHER): Payer: 59

## 2018-02-10 DIAGNOSIS — Z0184 Encounter for antibody response examination: Secondary | ICD-10-CM | POA: Diagnosis not present

## 2018-02-10 DIAGNOSIS — Z13220 Encounter for screening for lipoid disorders: Secondary | ICD-10-CM

## 2018-02-10 DIAGNOSIS — Z1389 Encounter for screening for other disorder: Secondary | ICD-10-CM

## 2018-02-10 DIAGNOSIS — E559 Vitamin D deficiency, unspecified: Secondary | ICD-10-CM

## 2018-02-10 DIAGNOSIS — Z1329 Encounter for screening for other suspected endocrine disorder: Secondary | ICD-10-CM

## 2018-02-10 DIAGNOSIS — R739 Hyperglycemia, unspecified: Secondary | ICD-10-CM | POA: Diagnosis not present

## 2018-02-10 DIAGNOSIS — Z13818 Encounter for screening for other digestive system disorders: Secondary | ICD-10-CM

## 2018-02-10 DIAGNOSIS — Z1159 Encounter for screening for other viral diseases: Secondary | ICD-10-CM

## 2018-02-10 DIAGNOSIS — Z0000 Encounter for general adult medical examination without abnormal findings: Secondary | ICD-10-CM | POA: Diagnosis not present

## 2018-02-10 LAB — CBC WITH DIFFERENTIAL/PLATELET
BASOS ABS: 0 10*3/uL (ref 0.0–0.1)
Basophils Relative: 0.7 % (ref 0.0–3.0)
EOS ABS: 0.1 10*3/uL (ref 0.0–0.7)
Eosinophils Relative: 1.6 % (ref 0.0–5.0)
HCT: 44.1 % (ref 39.0–52.0)
Hemoglobin: 15.6 g/dL (ref 13.0–17.0)
LYMPHS ABS: 1.6 10*3/uL (ref 0.7–4.0)
Lymphocytes Relative: 25.2 % (ref 12.0–46.0)
MCHC: 35.4 g/dL (ref 30.0–36.0)
MCV: 88.2 fl (ref 78.0–100.0)
MONOS PCT: 6 % (ref 3.0–12.0)
Monocytes Absolute: 0.4 10*3/uL (ref 0.1–1.0)
NEUTROS PCT: 66.5 % (ref 43.0–77.0)
Neutro Abs: 4.2 10*3/uL (ref 1.4–7.7)
Platelets: 177 10*3/uL (ref 150.0–400.0)
RBC: 5 Mil/uL (ref 4.22–5.81)
RDW: 13.1 % (ref 11.5–15.5)
WBC: 6.3 10*3/uL (ref 4.0–10.5)

## 2018-02-10 LAB — URINALYSIS, ROUTINE W REFLEX MICROSCOPIC
BILIRUBIN URINE: NEGATIVE
Hgb urine dipstick: NEGATIVE
Ketones, ur: NEGATIVE
Leukocytes, UA: NEGATIVE
Nitrite: NEGATIVE
PH: 6 (ref 5.0–8.0)
RBC / HPF: NONE SEEN (ref 0–?)
SPECIFIC GRAVITY, URINE: 1.025 (ref 1.000–1.030)
Total Protein, Urine: NEGATIVE
Urine Glucose: NEGATIVE
Urobilinogen, UA: 1 (ref 0.0–1.0)

## 2018-02-10 LAB — LIPID PANEL
CHOL/HDL RATIO: 5
Cholesterol: 141 mg/dL (ref 0–200)
HDL: 30.1 mg/dL — ABNORMAL LOW (ref >39.00)
LDL Cholesterol: 93 mg/dL (ref 0–99)
NonHDL: 110.83
Triglycerides: 88 mg/dL (ref 0.0–149.0)
VLDL: 17.6 mg/dL (ref 0.0–40.0)

## 2018-02-10 LAB — T4, FREE: FREE T4: 1.05 ng/dL (ref 0.60–1.60)

## 2018-02-10 LAB — COMPREHENSIVE METABOLIC PANEL
ALBUMIN: 4.2 g/dL (ref 3.5–5.2)
ALK PHOS: 78 U/L (ref 39–117)
ALT: 20 U/L (ref 0–53)
AST: 20 U/L (ref 0–37)
BUN: 8 mg/dL (ref 6–23)
CO2: 32 mEq/L (ref 19–32)
CREATININE: 1.31 mg/dL (ref 0.40–1.50)
Calcium: 9.2 mg/dL (ref 8.4–10.5)
Chloride: 104 mEq/L (ref 96–112)
GFR: 64.48 mL/min (ref >60.00)
Glucose, Bld: 112 mg/dL — ABNORMAL HIGH (ref 70–99)
Potassium: 4.3 mEq/L (ref 3.5–5.1)
Sodium: 141 mEq/L (ref 135–145)
TOTAL PROTEIN: 6.8 g/dL (ref 6.0–8.3)
Total Bilirubin: 1.2 mg/dL (ref 0.2–1.2)

## 2018-02-10 LAB — HEMOGLOBIN A1C: HEMOGLOBIN A1C: 4.7 % (ref 4.6–6.5)

## 2018-02-10 LAB — TSH: TSH: 1.23 u[IU]/mL (ref 0.35–4.50)

## 2018-02-10 LAB — VITAMIN D 25 HYDROXY (VIT D DEFICIENCY, FRACTURES): VITD: 45.92 ng/mL (ref 30.00–100.00)

## 2018-02-11 LAB — MEASLES/MUMPS/RUBELLA IMMUNITY
Mumps IgG: 48.9 AU/mL
Rubella: <0.9 index — ABNORMAL LOW

## 2018-02-11 LAB — HEPATITIS B SURFACE ANTIBODY, QUANTITATIVE

## 2018-02-11 LAB — HEPATITIS A ANTIBODY, TOTAL: Hepatitis A AB,Total: NONREACTIVE

## 2018-05-07 ENCOUNTER — Ambulatory Visit: Payer: 59 | Admitting: Internal Medicine

## 2018-05-26 ENCOUNTER — Ambulatory Visit: Payer: 59 | Admitting: Internal Medicine

## 2018-05-26 ENCOUNTER — Encounter: Payer: Self-pay | Admitting: Internal Medicine

## 2018-05-26 VITALS — BP 124/80 | HR 78 | Temp 97.7°F | Ht 72.0 in | Wt 240.2 lb

## 2018-05-26 DIAGNOSIS — R42 Dizziness and giddiness: Secondary | ICD-10-CM

## 2018-05-26 DIAGNOSIS — H538 Other visual disturbances: Secondary | ICD-10-CM

## 2018-05-26 DIAGNOSIS — E669 Obesity, unspecified: Secondary | ICD-10-CM

## 2018-05-26 DIAGNOSIS — E66811 Obesity, class 1: Secondary | ICD-10-CM

## 2018-05-26 DIAGNOSIS — Z86018 Personal history of other benign neoplasm: Secondary | ICD-10-CM | POA: Diagnosis not present

## 2018-05-26 NOTE — Progress Notes (Signed)
Pre visit review using our clinic review tool, if applicable. No additional management support is needed unless otherwise documented below in the visit note. 

## 2018-05-26 NOTE — Progress Notes (Signed)
Chief Complaint  Patient presents with  . Follow-up   F/u  1. C/o dizziness and double vision during the summertime sxs resolved now reviewed labs 01/2018  2. Obesity working 6 days per week not currently exercising and eating fast food 2x per day    Review of Systems  Constitutional: Negative for weight loss.  HENT: Negative for hearing loss.   Eyes: Negative for blurred vision.  Respiratory: Negative for shortness of breath.   Cardiovascular: Negative for chest pain.  Skin: Negative for rash.  Neurological: Negative for dizziness and headaches.  Psychiatric/Behavioral: Negative for depression.   Past Medical History:  Diagnosis Date  . Chicken pox   . Nephrolithiasis    kidney stones 2011   . Viral encephalitis    MRI/V neg 12/01/09    Past Surgical History:  Procedure Laterality Date  . EXTRACORPOREAL SHOCK WAVE LITHOTRIPSY  2013  . WISDOM TOOTH EXTRACTION     Family History  Problem Relation Age of Onset  . Kidney Stones Father   . Diabetes Mother   . Hypertension Mother   . Alcohol abuse Maternal Grandfather   . Cancer Maternal Grandfather        MM  . Drug abuse Maternal Grandfather   . Heart disease Maternal Grandfather   . Bladder Cancer Neg Hx   . Prostate cancer Neg Hx    Social History   Socioeconomic History  . Marital status: Married    Spouse name: Not on file  . Number of children: Not on file  . Years of education: Not on file  . Highest education level: Not on file  Occupational History  . Not on file  Social Needs  . Financial resource strain: Not on file  . Food insecurity:    Worry: Not on file    Inability: Not on file  . Transportation needs:    Medical: Not on file    Non-medical: Not on file  Tobacco Use  . Smoking status: Former Games developer  . Smokeless tobacco: Current User    Types: Chew  Substance and Sexual Activity  . Alcohol use: Yes    Alcohol/week: 0.0 standard drinks    Comment: socially  . Drug use: No  . Sexual  activity: Yes  Lifestyle  . Physical activity:    Days per week: Not on file    Minutes per session: Not on file  . Stress: Not on file  Relationships  . Social connections:    Talks on phone: Not on file    Gets together: Not on file    Attends religious service: Not on file    Active member of club or organization: Not on file    Attends meetings of clubs or organizations: Not on file    Relationship status: Not on file  . Intimate partner violence:    Fear of current or ex partner: Not on file    Emotionally abused: Not on file    Physically abused: Not on file    Forced sexual activity: Not on file  Other Topics Concern  . Not on file  Social History Narrative   Owns guns, wears seat belt, safe in relationship    12th grade ed    3 kids 2 girls 1 boy    Also does sewage work    No outpatient medications have been marked as taking for the 05/26/18 encounter (Office Visit) with McLean-Scocuzza, Pasty Spillers, MD.   Allergies  Allergen Reactions  . Penicillin G  Rash   No results found for this or any previous visit (from the past 2160 hour(s)). Objective  Body mass index is 32.58 kg/m. Wt Readings from Last 3 Encounters:  05/26/18 240 lb 3.2 oz (109 kg)  02/03/18 238 lb 6.4 oz (108.1 kg)  11/15/16 235 lb (106.6 kg)   Temp Readings from Last 3 Encounters:  05/26/18 97.7 F (36.5 C) (Oral)  02/03/18 98.4 F (36.9 C) (Oral)  11/15/16 98.9 F (37.2 C) (Oral)   BP Readings from Last 3 Encounters:  05/26/18 124/80  02/03/18 138/80  11/15/16 127/87   Pulse Readings from Last 3 Encounters:  05/26/18 78  02/03/18 72  11/15/16 (!) 56    Physical Exam  Constitutional: He is oriented to person, place, and time. Vital signs are normal. He appears well-developed and well-nourished. He is cooperative.  HENT:  Head: Normocephalic and atraumatic.  Mouth/Throat: Oropharynx is clear and moist and mucous membranes are normal.  Eyes: Pupils are equal, round, and reactive to  light. Conjunctivae are normal.  Cardiovascular: Normal rate, regular rhythm and normal heart sounds.  Pulmonary/Chest: Effort normal and breath sounds normal.  Neurological: He is alert and oriented to person, place, and time. Gait normal.  Skin: Skin is warm, dry and intact.  Psychiatric: He has a normal mood and affect. His speech is normal and behavior is normal. Judgment and thought content normal. Cognition and memory are normal.  Nursing note and vitals reviewed.   Assessment   1. Dizziness and double vision resolved no etiology  2. Obesity 32.58  3. HM Plan   1. If returns rec vision check and will do CT/MRI CT 2011 negative  2.  rec exercise and healthy diet choices  3.  Declines flu shot today   Tdap utd  Labs had 01/2018  HIV neg 12/07/09  Refer to dermatology tbse check on referral today tbse   Former smoker now does chewing tobacco qd rec cessation  [facility]   Provider: Dr. French Ana McLean-Scocuzza-Internal Medicine

## 2018-05-26 NOTE — Patient Instructions (Addendum)
Consider get vision checked and if symptoms come back call me I will order CT or MRI brain in future for dizziness/blurry vision    Results for MCDONALD, REILING (MRN [id number]) as of 05/26/2018 10:17  Ref. Range 02/10/2018 08:00  Rubella Latest Units: index <0.90 (L)  Hepatitis A AB,Total Latest Ref Range: NON-REACTI  NON-REACTIVE  Hepatitis B-Post Latest Ref Range: > OR = 10 mIU/mL >1,000  Mumps IgG Latest Units: AU/mL 48.90  Rubeola IgG Latest Units: AU/mL <25.00 (L)   Consider MMR vaccine [id number] call to schedule at health dept if interested   Dizziness Dizziness is a common problem. It is a feeling of unsteadiness or light-headedness. You may feel like you are about to faint. Dizziness can lead to injury if you stumble or fall. Anyone can become dizzy, but dizziness is more common in older adults. This condition can be caused by a number of things, including medicines, dehydration, or illness. Follow these instructions at home: Eating and drinking  Drink enough fluid to keep your urine clear or pale yellow. This helps to keep you from becoming dehydrated. Try to drink more clear fluids, such as water.  Do not drink alcohol.  Limit your caffeine intake if told to do so by your health care provider. Check ingredients and nutrition facts to see if a food or beverage contains caffeine.  Limit your salt (sodium) intake if told to do so by your health care provider. Check ingredients and nutrition facts to see if a food or beverage contains sodium. Activity  Avoid making quick movements. ? Rise slowly from chairs and steady yourself until you feel okay. ? In the morning, first sit up on the side of the bed. When you feel okay, stand slowly while you hold onto something until you know that your balance is fine.  If you need to stand in one place for a long time, move your legs often. Tighten and relax the muscles in your legs while you are standing.  Do not drive or use heavy  machinery if you feel dizzy.  Avoid bending down if you feel dizzy. Place items in your home so that they are easy for you to reach without leaning over. Lifestyle  Do not use any products that contain nicotine or tobacco, such as cigarettes and e-cigarettes. If you need help quitting, ask your health care provider.  Try to reduce your stress level by using methods such as yoga or meditation. Talk with your health care provider if you need help to manage your stress. General instructions  Watch your dizziness for any changes.  Take over-the-counter and prescription medicines only as told by your health care provider. Talk with your health care provider if you think that your dizziness is caused by a medicine that you are taking.  Tell a friend or a family member that you are feeling dizzy. If he or she notices any changes in your behavior, have this person call your health care provider.  Keep all follow-up visits as told by your health care provider. This is important. Contact a health care provider if:  Your dizziness does not go away.  Your dizziness or light-headedness gets worse.  You feel nauseous.  You have reduced hearing.  You have new symptoms.  You are unsteady on your feet or you feel like the room is spinning. Get help right away if:  You vomit or have diarrhea and are unable to eat or drink anything.  You have problems  talking, walking, swallowing, or using your arms, hands, or legs.  You feel generally weak.  You are not thinking clearly or you have trouble forming sentences. It may take a friend or family member to notice this.  You have chest pain, abdominal pain, shortness of breath, or sweating.  Your vision changes.  You have any bleeding.  You have a severe headache.  You have neck pain or a stiff neck.  You have a fever. These symptoms may represent a serious problem that is an emergency. Do not wait to see if the symptoms will go away. Get  medical help right away. Call your local emergency services (911 in the [place] Do not drive yourself to the hospital. Summary  Dizziness is a feeling of unsteadiness or light-headedness. This condition can be caused by a number of things, including medicines, dehydration, or illness.  Anyone can become dizzy, but dizziness is more common in older adults.  Drink enough fluid to keep your urine clear or pale yellow. Do not drink alcohol.  Avoid making quick movements if you feel dizzy. Monitor your dizziness for any changes. This information is not intended to replace advice given to you by your health care provider. Make sure you discuss any questions you have with your health care provider. Document Released: 01/08/2001 Document Revised: 08/17/2016 Document Reviewed: 08/17/2016 Elsevier Interactive Patient Education  [employer].

## 2019-03-10 ENCOUNTER — Ambulatory Visit: Payer: 59 | Admitting: Internal Medicine

## 2019-03-10 DIAGNOSIS — Z0289 Encounter for other administrative examinations: Secondary | ICD-10-CM

## 2019-03-24 ENCOUNTER — Encounter: Payer: Self-pay | Admitting: Internal Medicine

## 2019-03-24 ENCOUNTER — Ambulatory Visit (INDEPENDENT_AMBULATORY_CARE_PROVIDER_SITE_OTHER): Payer: Self-pay | Admitting: Internal Medicine

## 2019-03-24 VITALS — Ht 72.0 in | Wt 230.0 lb

## 2019-03-24 DIAGNOSIS — E669 Obesity, unspecified: Secondary | ICD-10-CM

## 2019-03-24 DIAGNOSIS — Z0000 Encounter for general adult medical examination without abnormal findings: Secondary | ICD-10-CM | POA: Insufficient documentation

## 2019-03-24 DIAGNOSIS — Z0289 Encounter for other administrative examinations: Secondary | ICD-10-CM

## 2019-03-24 DIAGNOSIS — Z1283 Encounter for screening for malignant neoplasm of skin: Secondary | ICD-10-CM

## 2019-03-24 NOTE — Progress Notes (Signed)
Virtual Visit via Video Note  I connected with Adam Barron  on 03/24/19 at  4:00 PM EDT by a video enabled telemedicine application and verified that I am speaking with the correct person using two identifiers.  Location patient: work in [employer] or home office Persons participating in the virtual visit: patient, provider  I discussed the limitations of evaluation and management by telemedicine and the availability of in person appointments. The patient expressed understanding and agreed to proceed.   HPI: 1. Annual doing well no complaints except moles to skin with h/o bx and one on his back his wife is concerned about   2. Obesity wt is down to 230 lbs he is walking 3.5 mils 4 days per week with wife but likes to eat fast food due to work schedule but will cut back    ROS: See pertinent positives and negatives per HPI. General: wt down 10 lbs HEENT: no sore  Throat CV: no chest pain  Lungs: no sob  GI: no ab pain or blood in stool  GU: no issues  MSK: no jt pain  Skin: +moles  Neuro: no h/a  Psych: no anxiety/depression   Past Medical History:  Diagnosis Date  . Chicken pox   . Nephrolithiasis    kidney stones 2011   . Viral encephalitis    MRI/V neg 12/01/09     Past Surgical History:  Procedure Laterality Date  . EXTRACORPOREAL SHOCK WAVE LITHOTRIPSY  2013  . WISDOM TOOTH EXTRACTION      Family History  Problem Relation Age of Onset  . Kidney Stones Father   . Diabetes Mother   . Hypertension Mother   . Alcohol abuse Maternal Grandfather   . Cancer Maternal Grandfather        MM  . Drug abuse Maternal Grandfather   . Heart disease Maternal Grandfather   . Bladder Cancer Neg Hx   . 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  EXAM:  VITALS per patient if  applicable:  GENERAL: alert, oriented, appears well and in no acute distress  HEENT: atraumatic, conjunttiva clear, no obvious abnormalities on inspection of external nose and ears  NECK: normal movements of the head and neck  LUNGS: on inspection no signs of respiratory distress, breathing rate appears normal, no obvious gross SOB, gasping or wheezing  CV: no obvious cyanosis  MS: moves all visible extremities without noticeable abnormality  PSYCH/NEURO: pleasant and cooperative, no obvious depression or anxiety, speech and thought processing grossly intact  ASSESSMENT AND PLAN:  Discussed the following assessment and plan:  Annual physical exam - Plan: fasting labs 04/15/19 8:15 am  Comprehensive metabolic panel, CBC with Differential/Platelet, Lipid panel, TSH, Urinalysis, Routine w reflex microscopic  Declines flu shot  Tdap utd  Consider MMR vaccine  Hep B immune  HIV neg 12/07/09  Refer to dermatology tbse again not seen yet and referral initialy placed 01/2018   Former smoker now does chewing tobacco 1 can last 2 days - rec cessation  rec exercise to lose disc the Next 56 days and reduce fast food and continue exercise for goal wt loss of 10 lbs for now  [facility]     Obesity (BMI 30-39.9) - Plan: Lipid panel See above  I discussed the assessment and treatment plan with the patient. The patient was provided an opportunity to ask questions and all were answered. The patient agreed with the plan and demonstrated an understanding of the instructions.   The patient was advised to call back or seek an in-person evaluation if the symptoms worsen or if the condition fails to improve as anticipated.  Time spent 15 minutes Delorise Jackson, MD

## 2019-04-15 ENCOUNTER — Other Ambulatory Visit: Payer: Self-pay

## 2019-04-22 ENCOUNTER — Other Ambulatory Visit: Payer: Self-pay

## 2019-12-21 ENCOUNTER — Telehealth: Payer: Self-pay | Admitting: Internal Medicine

## 2019-12-21 NOTE — Telephone Encounter (Signed)
Sch appt can be virtual to discuss referral   TMS

## 2019-12-21 NOTE — Telephone Encounter (Signed)
Patient scheduled for a virtual 05/27 at 1:30

## 2019-12-21 NOTE — Telephone Encounter (Signed)
Pt called wants a referral to [facility] urological he has a lot of kidney stones.

## 2019-12-21 NOTE — Telephone Encounter (Signed)
Patient last seen via video 03/24/2019. Does he need an appointment for referral?

## 2019-12-23 ENCOUNTER — Encounter: Payer: Self-pay | Admitting: Internal Medicine

## 2019-12-23 ENCOUNTER — Telehealth: Payer: Self-pay | Admitting: Internal Medicine

## 2019-12-23 ENCOUNTER — Telehealth (INDEPENDENT_AMBULATORY_CARE_PROVIDER_SITE_OTHER): Payer: 59 | Admitting: Internal Medicine

## 2019-12-23 DIAGNOSIS — Z1329 Encounter for screening for other suspected endocrine disorder: Secondary | ICD-10-CM

## 2019-12-23 DIAGNOSIS — Z13220 Encounter for screening for lipoid disorders: Secondary | ICD-10-CM

## 2019-12-23 DIAGNOSIS — N200 Calculus of kidney: Secondary | ICD-10-CM

## 2019-12-23 DIAGNOSIS — Z0000 Encounter for general adult medical examination without abnormal findings: Secondary | ICD-10-CM

## 2019-12-23 NOTE — Telephone Encounter (Signed)
[facility] called wanted to know how big the stone was

## 2019-12-23 NOTE — Telephone Encounter (Signed)
Please advise 

## 2019-12-23 NOTE — Telephone Encounter (Signed)
Left detailed voicemail with information and patient information. Also informed that system is back up and electronic referral placed.

## 2019-12-23 NOTE — Patient Instructions (Signed)
Dietary Guidelines to Help Prevent Kidney Stones Kidney stones are deposits of minerals and salts that form inside your kidneys. Your risk of developing kidney stones may be greater depending on your diet, your lifestyle, the medicines you take, and whether you have certain medical conditions. Most people can reduce their chances of developing kidney stones by following the instructions below. Depending on your overall health and the type of kidney stones you tend to develop, your dietitian may give you more specific instructions. What are tips for following this plan? Reading food labels  Choose foods with "no salt added" or "low-salt" labels. Limit your sodium intake to less than 1500 mg per day.  Choose foods with calcium for each meal and snack. Try to eat about 300 mg of calcium at each meal. Foods that contain 200-500 mg of calcium per serving include: ? 8 oz (237 ml) of milk, fortified nondairy milk, and fortified fruit juice. ? 8 oz (237 ml) of kefir, yogurt, and soy yogurt. ? 4 oz (118 ml) of tofu. ? 1 oz of cheese. ? 1 cup (300 g) of dried figs. ? 1 cup (91 g) of cooked broccoli. ? 1-3 oz can of sardines or mackerel.  Most people need 1000 to 1500 mg of calcium each day. Talk to your dietitian about how much calcium is recommended for you. Shopping  Buy plenty of fresh fruits and vegetables. Most people do not need to avoid fruits and vegetables, even if they contain nutrients that may contribute to kidney stones.  When shopping for convenience foods, choose: ? Whole pieces of fruit. ? Premade salads with dressing on the side. ? Low-fat fruit and yogurt smoothies.  Avoid buying frozen meals or prepared deli foods.  Look for foods with live cultures, such as yogurt and kefir. Cooking  Do not add salt to food when cooking. Place a salt shaker on the table and allow each person to add his or her own salt to taste.  Use vegetable protein, such as beans, textured vegetable  protein (TVP), or tofu instead of meat in pasta, casseroles, and soups. Meal planning   Eat less salt, if told by your dietitian. To do this: ? Avoid eating processed or premade food. ? Avoid eating fast food.  Eat less animal protein, including cheese, meat, poultry, or fish, if told by your dietitian. To do this: ? Limit the number of times you have meat, poultry, fish, or cheese each week. Eat a diet free of meat at least 2 days a week. ? Eat only one serving each day of meat, poultry, fish, or seafood. ? When you prepare animal protein, cut pieces into small portion sizes. For most meat and fish, one serving is about the size of one deck of cards.  Eat at least 5 servings of fresh fruits and vegetables each day. To do this: ? Keep fruits and vegetables on hand for snacks. ? Eat 1 piece of fruit or a handful of berries with breakfast. ? Have a salad and fruit at lunch. ? Have two kinds of vegetables at dinner.  Limit foods that are high in a substance called oxalate. These include: ? Spinach. ? Rhubarb. ? Beets. ? Potato chips and french fries. ? Nuts.  If you regularly take a diuretic medicine, make sure to eat at least 1-2 fruits or vegetables high in potassium each day. These include: ? Avocado. ? Banana. ? Orange, prune, carrot, or tomato juice. ? Baked potato. ? Cabbage. ? Beans and split   peas. General instructions   Drink enough fluid to keep your urine clear or pale yellow. This is the most important thing you can do.  Talk to your health care provider and dietitian about taking daily supplements. Depending on your health and the cause of your kidney stones, you may be advised: ? Not to take supplements with vitamin C. ? To take a calcium supplement. ? To take a daily probiotic supplement. ? To take other supplements such as magnesium, fish oil, or vitamin B6.  Take all medicines and supplements as told by your health care provider.  Limit alcohol intake to no  more than 1 drink a day for nonpregnant women and 2 drinks a day for men. One drink equals 12 oz of beer, 5 oz of wine, or 1 oz of hard liquor.  Lose weight if told by your health care provider. Work with your dietitian to find strategies and an eating plan that works best for you. What foods are not recommended? Limit your intake of the following foods, or as told by your dietitian. Talk to your dietitian about specific foods you should avoid based on the type of kidney stones and your overall health. Grains Breads. Bagels. Rolls. Baked goods. Salted crackers. Cereal. Pasta. Vegetables Spinach. Rhubarb. Beets. Canned vegetables. Pickles. Olives. Meats and other protein foods Nuts. Nut butters. Large portions of meat, poultry, or fish. Salted or cured meats. Deli meats. Hot dogs. Sausages. Dairy Cheese. Beverages Regular soft drinks. Regular vegetable juice. Seasonings and other foods Seasoning blends with salt. Salad dressings. Canned soups. Soy sauce. Ketchup. Barbecue sauce. Canned pasta sauce. Casseroles. Pizza. Lasagna. Frozen meals. Potato chips. French fries. Summary  You can reduce your risk of kidney stones by making changes to your diet.  The most important thing you can do is drink enough fluid. You should drink enough fluid to keep your urine clear or pale yellow.  Ask your health care provider or dietitian how much protein from animal sources you should eat each day, and also how much salt and calcium you should have each day. This information is not intended to replace advice given to you by your health care provider. Make sure you discuss any questions you have with your health care provider. Document Revised: 11/04/2018 Document Reviewed: 06/25/2016 Elsevier Patient Education  2020 Elsevier Inc.  

## 2019-12-23 NOTE — Telephone Encounter (Signed)
I think its 1 cm noted on Xray prior  TMS

## 2019-12-23 NOTE — Progress Notes (Signed)
Phone Note  I connected with Adam Barron  on 12/23/19 at  1:30 PM EDT by telephone and verified that I am speaking with the correct person using two identifiers.  Location patient: home Location provider:work or home office Persons participating in the virtual visit: patient, provider  I discussed the limitations of evaluation and management by telemedicine and the availability of in person appointments. The patient expressed understanding and agreed to proceed.   HPI: 1. H/o kidney stones s/p lithotripsy having flank pain x 1 month but not passing kidney stones H/o recurrent kidney stones last was 1 cm per Xray but did not pass having achy flank pain b/l denies hematuria, fever/ab pain    ROS: See pertinent positives and negatives per HPI.  Past Medical History:  Diagnosis Date  . Chicken pox   . Nephrolithiasis    kidney stones 2011   . Viral encephalitis    MRI/V neg 12/01/09     Past Surgical History:  Procedure Laterality Date  . EXTRACORPOREAL SHOCK WAVE LITHOTRIPSY  2013  . WISDOM TOOTH EXTRACTION      Family History  Problem Relation Age of Onset  . Kidney Stones Father   . Diabetes Mother   . Hypertension Mother   . Alcohol abuse Maternal Grandfather   . Cancer Maternal Grandfather        MM  . Drug abuse Maternal Grandfather   . Heart disease Maternal Grandfather   . Bladder Cancer Neg Hx   . Prostate cancer Neg Hx     SOCIAL HX:  Owns guns, wears seat belt, safe in relationship  12th grade ed  3 kids 2 girls 1 boy  Also does sewage work  No current outpatient medications on file.  EXAM:  VITALS per patient if applicable:  GENERAL: alert, oriented, appears well and in no acute distress  PSYCH/NEURO: pleasant and cooperative, no obvious depression or anxiety, speech and thought processing grossly intact  ASSESSMENT AND PLAN:  Discussed the following assessment and plan:  Recurrent kidney stones - Plan: Ambulatory referral to  Urology  HM Declines flu shot  Tdap utd  Consider MMR vaccine  Hep B immune  HIV neg 12/07/09  Referred to dermatology tbseagain not seen yet and referral initialy placed 01/2018   Former smoker now does chewing tobacco 1 can last 2 days - rec cessation  rec exercise to lose disc the Next 56 days and reduce fast food and continue exercise rec healthy diet and exercise  [facility] -we discussed possible serious and likely etiologies, options for evaluation and workup, limitations of telemedicine visit vs in person visit, treatment, treatment risks and precautions. Pt prefers to treat via telemedicine empirically rather then risking or undertaking an in person visit at this moment. Patient agrees to seek prompt in person care if worsening, new symptoms arise, or if is not improving with treatment.   I discussed the assessment and treatment plan with the patient. The patient was provided an opportunity to ask questions and all were answered. The patient agreed with the plan and demonstrated an understanding of the instructions.   The patient was advised to call back or seek an in-person evaluation if the symptoms worsen or if the condition fails to improve as anticipated.  Time spent 20 min Delorise Jackson, MD

## 2019-12-29 ENCOUNTER — Encounter: Payer: Self-pay | Admitting: Urology

## 2019-12-29 ENCOUNTER — Ambulatory Visit: Payer: 59 | Admitting: Urology

## 2019-12-29 ENCOUNTER — Other Ambulatory Visit: Payer: Self-pay

## 2019-12-29 VITALS — BP 137/86 | HR 71 | Ht 72.0 in | Wt 234.0 lb

## 2019-12-29 DIAGNOSIS — R1012 Left upper quadrant pain: Secondary | ICD-10-CM | POA: Diagnosis not present

## 2019-12-29 DIAGNOSIS — N200 Calculus of kidney: Secondary | ICD-10-CM | POA: Diagnosis not present

## 2019-12-29 LAB — URINALYSIS, COMPLETE
Bilirubin, UA: NEGATIVE
Glucose, UA: NEGATIVE
Ketones, UA: NEGATIVE
Leukocytes,UA: NEGATIVE
Nitrite, UA: NEGATIVE
Protein,UA: NEGATIVE
Specific Gravity, UA: 1.02 (ref 1.005–1.030)
Urobilinogen, Ur: 2 mg/dL — ABNORMAL HIGH (ref 0.2–1.0)
pH, UA: 6.5 (ref 5.0–7.5)

## 2019-12-29 LAB — MICROSCOPIC EXAMINATION: Bacteria, UA: NONE SEEN

## 2019-12-29 NOTE — Progress Notes (Signed)
   12/29/19 1:45 PM   Adam Barron 06-22-1976 [id number]  CC: Nephrolithiasis  HPI: I saw Adam Barron for possible nephrolithiasis.  He is a 44 year old male with a history of kidney stones, most recently spontaneous passage was about a year ago.  He does have a history of lithotripsy x1 about 5 years ago.  He has been having some intermittent left-sided pain over the last 6 months that he thinks may be related to a kidney stone.  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  He denies any gross hematuria.  There is no recent imaging to review.  He denies any fevers or chills.  Urinalysis is benign today with 0-5 WBCs, 0-2 RBCs, no bacteria, nitrite negative.  PMH: Past Medical History:  Diagnosis Date  . Chicken pox   . Nephrolithiasis    kidney stones 2011   . Viral encephalitis    MRI/V neg 12/01/09     Surgical History: Past Surgical History:  Procedure Laterality Date  . EXTRACORPOREAL SHOCK WAVE LITHOTRIPSY  2013  . VASECTOMY    . WISDOM TOOTH EXTRACTION      Family History: Family History  Problem Relation Age of Onset  . Kidney Stones Father   . Diabetes Mother   . Hypertension Mother   . Alcohol abuse Maternal Grandfather   . Cancer Maternal Grandfather        MM  . Drug abuse Maternal Grandfather   . Heart disease Maternal Grandfather   . Bladder Cancer Neg Hx   . Prostate cancer Neg Hx     Social History:  reports that he has quit smoking. His smokeless tobacco use includes chew. He reports current alcohol use. He reports that he does not use drugs.  Physical Exam: BP 137/86 (BP Location: Left Arm, Patient Position: Sitting, Cuff Size: Large)   Pulse 71   Ht 6' (1.829 m)   Wt 234 lb (106.1 kg)   BMI 31.74 kg/m    Constitutional:  Alert and oriented, No acute distress. Cardiovascular: No clubbing, cyanosis, or edema. Respiratory: Normal respiratory effort, no increased work of breathing. GI: Abdomen is soft,  nontender, nondistended, no abdominal masses GU: No CVA tenderness   Assessment & Plan:   In summary, is a 44 year old male with a history of nephrolithiasis and intermittent left-sided flank pain over the last 6 months that he feels might be related to a kidney stone.  His pain is worse with physical activity, and his urinalysis is completely benign today.  We discussed the pros and cons of different imaging options including KUB with lower cost and lower radiation but less information, versus CT with higher cost but more information regarding stone size, density, and location, as well as any possible other etiology for his pain.  He would like to pursue CT.  CT stone protocol, call with results   Legrand Rams, MD 12/29/2019  [facility] [address], [address] [place], [place] [address] [phone]

## 2019-12-29 NOTE — Patient Instructions (Signed)
Dietary Guidelines to Help Prevent Kidney Stones Kidney stones are deposits of minerals and salts that form inside your kidneys. Your risk of developing kidney stones may be greater depending on your diet, your lifestyle, the medicines you take, and whether you have certain medical conditions. Most people can reduce their chances of developing kidney stones by following the instructions below. Depending on your overall health and the type of kidney stones you tend to develop, your dietitian may give you more specific instructions. What are tips for following this plan? Reading food labels  Choose foods with "no salt added" or "low-salt" labels. Limit your sodium intake to less than 1500 mg per day.  Choose foods with calcium for each meal and snack. Try to eat about 300 mg of calcium at each meal. Foods that contain 200-500 mg of calcium per serving include: ? 8 oz (237 ml) of milk, fortified nondairy milk, and fortified fruit juice. ? 8 oz (237 ml) of kefir, yogurt, and soy yogurt. ? 4 oz (118 ml) of tofu. ? 1 oz of cheese. ? 1 cup (300 g) of dried figs. ? 1 cup (91 g) of cooked broccoli. ? 1-3 oz can of sardines or mackerel.  Most people need 1000 to 1500 mg of calcium each day. Talk to your dietitian about how much calcium is recommended for you. Shopping  Buy plenty of fresh fruits and vegetables. Most people do not need to avoid fruits and vegetables, even if they contain nutrients that may contribute to kidney stones.  When shopping for convenience foods, choose: ? Whole pieces of fruit. ? Premade salads with dressing on the side. ? Low-fat fruit and yogurt smoothies.  Avoid buying frozen meals or prepared deli foods.  Look for foods with live cultures, such as yogurt and kefir. Cooking  Do not add salt to food when cooking. Place a salt shaker on the table and allow each person to add his or her own salt to taste.  Use vegetable protein, such as beans, textured vegetable  protein (TVP), or tofu instead of meat in pasta, casseroles, and soups. Meal planning   Eat less salt, if told by your dietitian. To do this: ? Avoid eating processed or premade food. ? Avoid eating fast food.  Eat less animal protein, including cheese, meat, poultry, or fish, if told by your dietitian. To do this: ? Limit the number of times you have meat, poultry, fish, or cheese each week. Eat a diet free of meat at least 2 days a week. ? Eat only one serving each day of meat, poultry, fish, or seafood. ? When you prepare animal protein, cut pieces into small portion sizes. For most meat and fish, one serving is about the size of one deck of cards.  Eat at least 5 servings of fresh fruits and vegetables each day. To do this: ? Keep fruits and vegetables on hand for snacks. ? Eat 1 piece of fruit or a handful of berries with breakfast. ? Have a salad and fruit at lunch. ? Have two kinds of vegetables at dinner.  Limit foods that are high in a substance called oxalate. These include: ? Spinach. ? Rhubarb. ? Beets. ? Potato chips and french fries. ? Nuts.  If you regularly take a diuretic medicine, make sure to eat at least 1-2 fruits or vegetables high in potassium each day. These include: ? Avocado. ? Banana. ? Orange, prune, carrot, or tomato juice. ? Baked potato. ? Cabbage. ? Beans and split   peas. General instructions   Drink enough fluid to keep your urine clear or pale yellow. This is the most important thing you can do.  Talk to your health care provider and dietitian about taking daily supplements. Depending on your health and the cause of your kidney stones, you may be advised: ? Not to take supplements with vitamin C. ? To take a calcium supplement. ? To take a daily probiotic supplement. ? To take other supplements such as magnesium, fish oil, or vitamin B6.  Take all medicines and supplements as told by your health care provider.  Limit alcohol intake to no  more than 1 drink a day for nonpregnant women and 2 drinks a day for men. One drink equals 12 oz of beer, 5 oz of wine, or 1 oz of hard liquor.  Lose weight if told by your health care provider. Work with your dietitian to find strategies and an eating plan that works best for you. What foods are not recommended? Limit your intake of the following foods, or as told by your dietitian. Talk to your dietitian about specific foods you should avoid based on the type of kidney stones and your overall health. Grains Breads. Bagels. Rolls. Baked goods. Salted crackers. Cereal. Pasta. Vegetables Spinach. Rhubarb. Beets. Canned vegetables. Pickles. Olives. Meats and other protein foods Nuts. Nut butters. Large portions of meat, poultry, or fish. Salted or cured meats. Deli meats. Hot dogs. Sausages. Dairy Cheese. Beverages Regular soft drinks. Regular vegetable juice. Seasonings and other foods Seasoning blends with salt. Salad dressings. Canned soups. Soy sauce. Ketchup. Barbecue sauce. Canned pasta sauce. Casseroles. Pizza. Lasagna. Frozen meals. Potato chips. French fries. Summary  You can reduce your risk of kidney stones by making changes to your diet.  The most important thing you can do is drink enough fluid. You should drink enough fluid to keep your urine clear or pale yellow.  Ask your health care provider or dietitian how much protein from animal sources you should eat each day, and also how much salt and calcium you should have each day. This information is not intended to replace advice given to you by your health care provider. Make sure you discuss any questions you have with your health care provider. Document Revised: 11/04/2018 Document Reviewed: 06/25/2016 Elsevier Patient Education  2020 Elsevier Inc.  

## 2020-03-24 ENCOUNTER — Encounter: Payer: Self-pay | Admitting: Internal Medicine

## 2020-03-28 ENCOUNTER — Other Ambulatory Visit (INDEPENDENT_AMBULATORY_CARE_PROVIDER_SITE_OTHER): Payer: 59

## 2020-03-28 ENCOUNTER — Other Ambulatory Visit: Payer: Self-pay

## 2020-03-28 DIAGNOSIS — Z0000 Encounter for general adult medical examination without abnormal findings: Secondary | ICD-10-CM

## 2020-03-28 DIAGNOSIS — Z13220 Encounter for screening for lipoid disorders: Secondary | ICD-10-CM

## 2020-03-28 DIAGNOSIS — Z1329 Encounter for screening for other suspected endocrine disorder: Secondary | ICD-10-CM | POA: Diagnosis not present

## 2020-03-28 DIAGNOSIS — N200 Calculus of kidney: Secondary | ICD-10-CM

## 2020-03-28 LAB — CBC WITH DIFFERENTIAL/PLATELET
Basophils Absolute: 0 10*3/uL (ref 0.0–0.1)
Basophils Relative: 0.5 % (ref 0.0–3.0)
Eosinophils Absolute: 0.1 10*3/uL (ref 0.0–0.7)
Eosinophils Relative: 2 % (ref 0.0–5.0)
HCT: 42.9 % (ref 39.0–52.0)
Hemoglobin: 15.2 g/dL (ref 13.0–17.0)
Lymphocytes Relative: 23.5 % (ref 12.0–46.0)
Lymphs Abs: 1.6 10*3/uL (ref 0.7–4.0)
MCHC: 35.5 g/dL (ref 30.0–36.0)
MCV: 87.7 fl (ref 78.0–100.0)
Monocytes Absolute: 0.4 10*3/uL (ref 0.1–1.0)
Monocytes Relative: 5.9 % (ref 3.0–12.0)
Neutro Abs: 4.5 10*3/uL (ref 1.4–7.7)
Neutrophils Relative %: 68.1 % (ref 43.0–77.0)
Platelets: 175 10*3/uL (ref 150.0–400.0)
RBC: 4.9 Mil/uL (ref 4.22–5.81)
RDW: 13.4 % (ref 11.5–15.5)
WBC: 6.6 10*3/uL (ref 4.0–10.5)

## 2020-03-28 LAB — LIPID PANEL
Cholesterol: 151 mg/dL (ref 0–200)
HDL: 31.5 mg/dL — ABNORMAL LOW (ref >39.00)
LDL Cholesterol: 106 mg/dL — ABNORMAL HIGH (ref 0–99)
NonHDL: 119.17
Total CHOL/HDL Ratio: 5
Triglycerides: 66 mg/dL (ref 0.0–149.0)
VLDL: 13.2 mg/dL (ref 0.0–40.0)

## 2020-03-28 LAB — COMPREHENSIVE METABOLIC PANEL
ALT: 22 U/L (ref 0–53)
AST: 25 U/L (ref 0–37)
Albumin: 4.2 g/dL (ref 3.5–5.2)
Alkaline Phosphatase: 82 U/L (ref 39–117)
BUN: 8 mg/dL (ref 6–23)
CO2: 32 mEq/L (ref 19–32)
Calcium: 9 mg/dL (ref 8.4–10.5)
Chloride: 103 mEq/L (ref 96–112)
Creatinine, Ser: 1.29 mg/dL (ref 0.40–1.50)
GFR: 61.11 mL/min (ref >60.00)
Glucose, Bld: 102 mg/dL — ABNORMAL HIGH (ref 70–99)
Potassium: 4.3 mEq/L (ref 3.5–5.1)
Sodium: 139 mEq/L (ref 135–145)
Total Bilirubin: 1.5 mg/dL — ABNORMAL HIGH (ref 0.2–1.2)
Total Protein: 6.6 g/dL (ref 6.0–8.3)

## 2020-03-28 LAB — TSH: TSH: 1.8 u[IU]/mL (ref 0.35–4.50)

## 2020-03-29 ENCOUNTER — Other Ambulatory Visit: Payer: 59

## 2020-03-29 LAB — URINALYSIS, ROUTINE W REFLEX MICROSCOPIC
Bilirubin Urine: NEGATIVE
Glucose, UA: NEGATIVE
Hgb urine dipstick: NEGATIVE
Leukocytes,Ua: NEGATIVE
Nitrite: NEGATIVE
Protein, ur: NEGATIVE
Specific Gravity, Urine: 1.021 (ref 1.001–1.03)
pH: 6.5 (ref 5.0–8.0)

## 2020-04-04 ENCOUNTER — Encounter: Payer: Self-pay | Admitting: Internal Medicine

## 2020-04-06 ENCOUNTER — Encounter: Payer: Self-pay | Admitting: Internal Medicine

## 2020-04-28 ENCOUNTER — Ambulatory Visit (INDEPENDENT_AMBULATORY_CARE_PROVIDER_SITE_OTHER): Payer: 59 | Admitting: Internal Medicine

## 2020-04-28 ENCOUNTER — Encounter: Payer: Self-pay | Admitting: Internal Medicine

## 2020-04-28 ENCOUNTER — Other Ambulatory Visit: Payer: Self-pay

## 2020-04-28 VITALS — BP 130/88 | HR 93 | Temp 98.3°F | Ht 72.0 in | Wt 237.6 lb

## 2020-04-28 DIAGNOSIS — Z0000 Encounter for general adult medical examination without abnormal findings: Secondary | ICD-10-CM | POA: Diagnosis not present

## 2020-04-28 DIAGNOSIS — N200 Calculus of kidney: Secondary | ICD-10-CM

## 2020-04-28 DIAGNOSIS — Z13818 Encounter for screening for other digestive system disorders: Secondary | ICD-10-CM | POA: Diagnosis not present

## 2020-04-28 DIAGNOSIS — Z13220 Encounter for screening for lipoid disorders: Secondary | ICD-10-CM

## 2020-04-28 DIAGNOSIS — Z1329 Encounter for screening for other suspected endocrine disorder: Secondary | ICD-10-CM

## 2020-04-28 DIAGNOSIS — E669 Obesity, unspecified: Secondary | ICD-10-CM

## 2020-04-28 DIAGNOSIS — R030 Elevated blood-pressure reading, without diagnosis of hypertension: Secondary | ICD-10-CM | POA: Diagnosis not present

## 2020-04-28 NOTE — Progress Notes (Signed)
Chief Complaint  Patient presents with  . Annual Exam   Annual  1. BP elevated 130/98 repeat 130/88 not on meds wt up 7 lbs and had port today eating BBQ pork [employer] which he likes about 3 days per week  Reviewed labs 03/28/20 HDL sl low, sl elevated bilriubin  Results for WILLEY, DUE (MRN [id number]) as of 04/28/2020 18:23  03/28/2020 08:02 Total Bilirubin: 1.5 (H) Results for YECHEZKEL, FERTIG (MRN [id number]) as of 04/28/2020 18:23  03/28/2020 08:02 Cholesterol: 151 HDL Cholesterol: 31.50 (L) LDL (calc): 106 (H) NonHDL: 119.17 Triglycerides: 66.0 VLDL: 13.2   Review of Systems  Constitutional: Negative for weight loss.  HENT: Negative for hearing loss.   Eyes: Negative for blurred vision.  Respiratory: Negative for shortness of breath.   Cardiovascular: Negative for chest pain.  Gastrointestinal: Negative for abdominal pain.  Musculoskeletal: Negative for falls.  Skin: Negative for rash.  Neurological: Negative for headaches.  Psychiatric/Behavioral: Negative for depression.   Past Medical History:  Diagnosis Date  . Chicken pox   . Nephrolithiasis    kidney stones 2011   . Viral encephalitis    MRI/V neg 12/01/09    Past Surgical History:  Procedure Laterality Date  . EXTRACORPOREAL SHOCK WAVE LITHOTRIPSY  2013  . VASECTOMY     since left >right testicles since   . WISDOM TOOTH EXTRACTION     Family History  Problem Relation Age of Onset  . Kidney Stones Father   . Colon polyps Father        intestine removed late 11s early 55s   . Diabetes Mother   . Hypertension Mother   . Colon polyps Sister   . Alcohol abuse Maternal Grandfather   . Cancer Maternal Grandfather        MM  . Drug abuse Maternal Grandfather   . Heart disease Maternal Grandfather   . Bladder Cancer Neg Hx   . Prostate cancer Neg Hx    Social History   Socioeconomic History  . Marital status: Married    Spouse name: Not on file  . Number of children: Not on file  . Years of  education: Not on file  . Highest education level: Not on file  Occupational History  . Not on file  Tobacco Use  . Smoking status: Former Research scientist (life sciences)  . Smokeless tobacco: Current User    Types: Chew  Substance and Sexual Activity  . Alcohol use: Yes    Alcohol/week: 0.0 standard drinks    Comment: socially  . Drug use: No  . Sexual activity: Yes  Other Topics Concern  . Not on file  Social History Narrative   Owns guns, wears seat belt, safe in relationship    12th grade ed    3 kids 2 girls 1 boy    Also does sewage work    Scientist, physiological Strain:   . Difficulty of Paying Living Expenses: Not on file  Food Insecurity:   . Worried About Charity fundraiser in the Last Year: Not on file  . Ran Out of Food in the Last Year: Not on file  Transportation Needs:   . Lack of Transportation (Medical): Not on file  . Lack of Transportation (Non-Medical): Not on file  Physical Activity:   . Days of Exercise per Week: Not on file  . Minutes of Exercise per Session: Not on file  Stress:   . Feeling of Stress : Not on file  Social Connections:   . Frequency of Communication with Friends and Family: Not on file  . Frequency of Social Gatherings with Friends and Family: Not on file  . Attends Religious Services: Not on file  . Active Member of Clubs or Organizations: Not on file  . Attends Archivist Meetings: Not on file  . Marital Status: Not on file  Intimate Partner Violence:   . Fear of Current or Ex-Partner: Not on file  . Emotionally Abused: Not on file  . Physically Abused: Not on file  . Sexually Abused: Not on file   No outpatient medications have been marked as taking for the 04/28/20 encounter (Office Visit) with McLean-Scocuzza, Nino Glow, MD.   Allergies  Allergen Reactions  . Penicillin G Rash   Recent Results (from the past 2160 hour(s))  TSH     Status: None   Collection Time: 03/28/20  8:02 AM  Result Value Ref Range    TSH 1.80 0.35 - 4.50 uIU/mL  CBC with Differential/Platelet     Status: None   Collection Time: 03/28/20  8:02 AM  Result Value Ref Range   WBC 6.6 4.0 - 10.5 K/uL   RBC 4.90 4.22 - 5.81 Mil/uL   Hemoglobin 15.2 13.0 - 17.0 g/dL   HCT 42.9 39 - 52 %   MCV 87.7 78.0 - 100.0 fl   MCHC 35.5 30.0 - 36.0 g/dL   RDW 13.4 11.5 - 15.5 %   Platelets 175.0 150 - 400 K/uL   Neutrophils Relative % 68.1 43 - 77 %   Lymphocytes Relative 23.5 12 - 46 %   Monocytes Relative 5.9 3 - 12 %   Eosinophils Relative 2.0 0 - 5 %   Basophils Relative 0.5 0 - 3 %   Neutro Abs 4.5 1.4 - 7.7 K/uL   Lymphs Abs 1.6 0.7 - 4.0 K/uL   Monocytes Absolute 0.4 0 - 1 K/uL   Eosinophils Absolute 0.1 0 - 0 K/uL   Basophils Absolute 0.0 0 - 0 K/uL  Lipid panel     Status: Abnormal   Collection Time: 03/28/20  8:02 AM  Result Value Ref Range   Cholesterol 151 0 - 200 mg/dL    Comment: ATP III Classification       Desirable:  < 200 mg/dL               Borderline High:  200 - 239 mg/dL          High:  > = 240 mg/dL   Triglycerides 66.0 0 - 149 mg/dL    Comment: Normal:  <150 mg/dLBorderline High:  150 - 199 mg/dL   HDL 31.50 (L) >39.00 mg/dL   VLDL 13.2 0.0 - 40.0 mg/dL   LDL Cholesterol 106 (H) 0 - 99 mg/dL   Total CHOL/HDL Ratio 5     Comment:                Men          Women1/2 Average Risk     3.4          3.3Average Risk          5.0          4.42X Average Risk          9.6          7.13X Average Risk          15.0          11.0  NonHDL 119.17     Comment: NOTE:  Non-HDL goal should be 30 mg/dL higher than patient's LDL goal (i.e. LDL goal of < 70 mg/dL, would have non-HDL goal of < 100 mg/dL)  Comprehensive metabolic panel     Status: Abnormal   Collection Time: 03/28/20  8:02 AM  Result Value Ref Range   Sodium 139 135 - 145 mEq/L   Potassium 4.3 3.5 - 5.1 mEq/L   Chloride 103 96 - 112 mEq/L   CO2 32 19 - 32 mEq/L   Glucose, Bld 102 (H) 70 - 99 mg/dL   BUN 8 6 - 23 mg/dL    Creatinine, Ser 1.29 0.40 - 1.50 mg/dL   Total Bilirubin 1.5 (H) 0.2 - 1.2 mg/dL   Alkaline Phosphatase 82 39 - 117 U/L   AST 25 0 - 37 U/L   ALT 22 0 - 53 U/L   Total Protein 6.6 6.0 - 8.3 g/dL   Albumin 4.2 3.5 - 5.2 g/dL   GFR 61.11 >60.00 mL/min   Calcium 9.0 8.4 - 10.5 mg/dL  Urinalysis, Routine w reflex microscopic     Status: Abnormal   Collection Time: 03/28/20  8:03 AM  Result Value Ref Range   Color, Urine DARK YELLOW YELLOW   APPearance CLEAR CLEAR   Specific Gravity, Urine 1.021 1.001 - 1.03   pH 6.5 5.0 - 8.0   Glucose, UA NEGATIVE NEGATIVE   Bilirubin Urine NEGATIVE NEGATIVE   Ketones, ur TRACE (A) NEGATIVE   Hgb urine dipstick NEGATIVE NEGATIVE   Protein, ur NEGATIVE NEGATIVE   Nitrite NEGATIVE NEGATIVE   Leukocytes,Ua NEGATIVE NEGATIVE   Objective  Body mass index is 32.22 kg/m. Wt Readings from Last 3 Encounters:  04/28/20 237 lb 9.6 oz (107.8 kg)  12/29/19 234 lb (106.1 kg)  03/24/19 230 lb (104.3 kg)   Temp Readings from Last 3 Encounters:  04/28/20 98.3 F (36.8 C) (Oral)  05/26/18 97.7 F (36.5 C) (Oral)  02/03/18 98.4 F (36.9 C) (Oral)   BP Readings from Last 3 Encounters:  04/28/20 130/88  12/29/19 137/86  05/26/18 124/80   Pulse Readings from Last 3 Encounters:  04/28/20 93  12/29/19 71  05/26/18 78    Physical Exam Vitals and nursing note reviewed.  Constitutional:      Appearance: Normal appearance. He is well-developed and well-groomed. He is obese.  HENT:     Head: Normocephalic and atraumatic.  Eyes:     Conjunctiva/sclera: Conjunctivae normal.     Pupils: Pupils are equal, round, and reactive to light.  Cardiovascular:     Rate and Rhythm: Normal rate and regular rhythm.     Heart sounds: Normal heart sounds. No murmur heard.   Pulmonary:     Effort: Pulmonary effort is normal.     Breath sounds: Normal breath sounds.  Skin:    General: Skin is warm and dry.  Neurological:     General: No focal deficit present.      Mental Status: He is alert and oriented to person, place, and time. Mental status is at baseline.     Gait: Gait normal.  Psychiatric:        Attention and Perception: Attention and perception normal.        Mood and Affect: Mood and affect normal.        Speech: Speech normal.        Behavior: Behavior normal. Behavior is cooperative.        Thought Content: Thought content  normal.        Cognition and Memory: Cognition normal.        Judgment: Judgment normal.     Assessment  Plan  Annual physical exam Declines flu shot Tdap utd Consider MMR vaccine  Hep B immune HIV neg 12/07/09  dermatology [id number] no need 04/28/20  colonoscopy age 61  psa 44 y.o   Former smoker now does chewing tobacco1 can last 2 days -rec cessation  REC exercise healthy diet and exercise  [facility]   Elevated blood pressure reading Monitor and buy BP cuff  Given log goal <130/<80   Recurrent kidney stones Saw urology Dr. Diamantina [facility] did not get CT scan due to passed kidney stone since appt 12/29/19 did not save kidney stone  PT AGREEABLE TO KUB INSTEAD MESSAGED DR SNINSKY TO SEE IF HE CAN ORDER KUB FOR PATIENT INSTEAD OF CT RENAL     Provider: Dr. Olivia Mackie McLean-Scocuzza-Internal Medicine

## 2020-04-28 NOTE — Patient Instructions (Addendum)
Cut back BBQ Goal blood pressure <130/<80  Consider [facility] [place] or [facility]    Please order blood pressure cuff [employer]   [place] water   Dietary Guidelines to Help Prevent Kidney Stones Kidney stones are deposits of minerals and salts that form inside your kidneys. Your risk of developing kidney stones may be greater depending on your diet, your lifestyle, the medicines you take, and whether you have certain medical conditions. Most people can reduce their chances of developing kidney stones by following the instructions below. Depending on your overall health and the type of kidney stones you tend to develop, your dietitian may give you more specific instructions. What are tips for following this plan? Reading food labels  Choose foods with "no salt added" or "low-salt" labels. Limit your sodium intake to less than 1500 mg per day.  Choose foods with calcium for each meal and snack. Try to eat about 300 mg of calcium at each meal. Foods that contain 200-500 mg of calcium per serving include: ? 8 oz (237 ml) of milk, fortified nondairy milk, and fortified fruit juice. ? 8 oz (237 ml) of kefir, yogurt, and soy yogurt. ? 4 oz (118 ml) of tofu. ? 1 oz of cheese. ? 1 cup (300 g) of dried figs. ? 1 cup (91 g) of cooked broccoli. ? 1-3 oz can of sardines or mackerel.  Most people need 1000 to 1500 mg of calcium each day. Talk to your dietitian about how much calcium is recommended for you. Shopping  Buy plenty of fresh fruits and vegetables. Most people do not need to avoid fruits and vegetables, even if they contain nutrients that may contribute to kidney stones.  When shopping for convenience foods, choose: ? Whole pieces of fruit. ? Premade salads with dressing on the side. ? Low-fat fruit and yogurt smoothies.  Avoid buying frozen meals or prepared deli foods.  Look for foods with live cultures, such as yogurt and kefir. Cooking  Do not add salt to food when cooking.  Place a salt shaker on the table and allow each person to add his or her own salt to taste.  Use vegetable protein, such as beans, textured vegetable protein (TVP), or tofu instead of meat in pasta, casseroles, and soups. Meal planning   Eat less salt, if told by your dietitian. To do this: ? Avoid eating processed or premade food. ? Avoid eating fast food.  Eat less animal protein, including cheese, meat, poultry, or fish, if told by your dietitian. To do this: ? Limit the number of times you have meat, poultry, fish, or cheese each week. Eat a diet free of meat at least 2 days a week. ? Eat only one serving each day of meat, poultry, fish, or seafood. ? When you prepare animal protein, cut pieces into small portion sizes. For most meat and fish, one serving is about the size of one deck of cards.  Eat at least 5 servings of fresh fruits and vegetables each day. To do this: ? Keep fruits and vegetables on hand for snacks. ? Eat 1 piece of fruit or a handful of berries with breakfast. ? Have a salad and fruit at lunch. ? Have two kinds of vegetables at dinner.  Limit foods that are high in a substance called oxalate. These include: ? Spinach. ? Rhubarb. ? Beets. ? Potato chips and french fries. ? Nuts.  If you regularly take a diuretic medicine, make sure to eat at least  1-2 fruits or vegetables high in potassium each day. These include: ? Avocado. ? Banana. ? Orange, prune, carrot, or tomato juice. ? Baked potato. ? Cabbage. ? Beans and split peas. General instructions   Drink enough fluid to keep your urine clear or pale yellow. This is the most important thing you can do.  Talk to your health care provider and dietitian about taking daily supplements. Depending on your health and the cause of your kidney stones, you may be advised: ? Not to take supplements with vitamin C. ? To take a calcium supplement. ? To take a daily probiotic supplement. ? To take other  supplements such as magnesium, fish oil, or vitamin B6.  Take all medicines and supplements as told by your health care provider.  Limit alcohol intake to no more than 1 drink a day for nonpregnant women and 2 drinks a day for men. One drink equals 12 oz of beer, 5 oz of wine, or 1 oz of hard liquor.  Lose weight if told by your health care provider. Work with your dietitian to find strategies and an eating plan that works best for you. What foods are not recommended? Limit your intake of the following foods, or as told by your dietitian. Talk to your dietitian about specific foods you should avoid based on the type of kidney stones and your overall health. Grains Breads. Bagels. Rolls. Baked goods. Salted crackers. Cereal. Pasta. Vegetables Spinach. Rhubarb. Beets. Canned vegetables. Rosita Fire. Olives. Meats and other protein foods Nuts. Nut butters. Large portions of meat, poultry, or fish. Salted or cured meats. Deli meats. Hot dogs. Sausages. Dairy Cheese. Beverages Regular soft drinks. Regular vegetable juice. Seasonings and other foods Seasoning blends with salt. Salad dressings. Canned soups. Soy sauce. Ketchup. Barbecue sauce. Canned pasta sauce. Casseroles. Pizza. Lasagna. Frozen meals. Potato chips. [place] fries. Summary  You can reduce your risk of kidney stones by making changes to your diet.  The most important thing you can do is drink enough fluid. You should drink enough fluid to keep your urine clear or pale yellow.  Ask your health care provider or dietitian how much protein from animal sources you should eat each day, and also how much salt and calcium you should have each day. This information is not intended to replace advice given to you by your health care provider. Make sure you discuss any questions you have with your health care provider. Document Revised: 11/04/2018 Document Reviewed: 06/25/2016 Elsevier Patient Education  2020 Elsevier Inc.   DASH Eating  Plan DASH stands for "Dietary Approaches to Stop Hypertension." The DASH eating plan is a healthy eating plan that has been shown to reduce high blood pressure (hypertension). It may also reduce your risk for type 2 diabetes, heart disease, and stroke. The DASH eating plan may also help with weight loss. What are tips for following this plan?  General guidelines  Avoid eating more than 2,300 mg (milligrams) of salt (sodium) a day. If you have hypertension, you may need to reduce your sodium intake to 1,500 mg a day.  Limit alcohol intake to no more than 1 drink a day for nonpregnant women and 2 drinks a day for men. One drink equals 12 oz of beer, 5 oz of wine, or 1 oz of hard liquor.  Work with your health care provider to maintain a healthy body weight or to lose weight. Ask what an ideal weight is for you.  Get at least 30 minutes of exercise that  causes your heart to beat faster (aerobic exercise) most days of the week. Activities may include walking, swimming, or biking.  Work with your health care provider or diet and nutrition specialist (dietitian) to adjust your eating plan to your individual calorie needs. Reading food labels   Check food labels for the amount of sodium per serving. Choose foods with less than 5 percent of the Daily Value of sodium. Generally, foods with less than 300 mg of sodium per serving fit into this eating plan.  To find whole grains, look for the word "whole" as the first word in the ingredient list. Shopping  Buy products labeled as "low-sodium" or "no salt added."  Buy fresh foods. Avoid canned foods and premade or frozen meals. Cooking  Avoid adding salt when cooking. Use salt-free seasonings or herbs instead of table salt or sea salt. Check with your health care provider or pharmacist before using salt substitutes.  Do not fry foods. Cook foods using healthy methods such as baking, boiling, grilling, and broiling instead.  Cook with  heart-healthy oils, such as olive, canola, soybean, or sunflower oil. Meal planning  Eat a balanced diet that includes: ? 5 or more servings of fruits and vegetables each day. At each meal, try to fill half of your plate with fruits and vegetables. ? Up to 6-8 servings of whole grains each day. ? Less than 6 oz of lean meat, poultry, or fish each day. A 3-oz serving of meat is about the same size as a deck of cards. One egg equals 1 oz. ? 2 servings of low-fat dairy each day. ? A serving of nuts, seeds, or beans 5 times each week. ? Heart-healthy fats. Healthy fats called Omega-3 fatty acids are found in foods such as flaxseeds and coldwater fish, like sardines, salmon, and mackerel.  Limit how much you eat of the following: ? Canned or prepackaged foods. ? Food that is high in trans fat, such as fried foods. ? Food that is high in saturated fat, such as fatty meat. ? Sweets, desserts, sugary drinks, and other foods with added sugar. ? Full-fat dairy products.  Do not salt foods before eating.  Try to eat at least 2 vegetarian meals each week.  Eat more home-cooked food and less restaurant, buffet, and fast food.  When eating at a restaurant, ask that your food be prepared with less salt or no salt, if possible. What foods are recommended? The items listed may not be a complete list. Talk with your dietitian about what dietary choices are best for you. Grains Whole-grain or whole-wheat bread. Whole-grain or whole-wheat pasta. Brown rice. Orpah Cobbatmeal. Quinoa. Bulgur. Whole-grain and low-sodium cereals. Pita bread. Low-fat, low-sodium crackers. Whole-wheat flour tortillas. Vegetables Fresh or frozen vegetables (raw, steamed, roasted, or grilled). Low-sodium or reduced-sodium tomato and vegetable juice. Low-sodium or reduced-sodium tomato sauce and tomato paste. Low-sodium or reduced-sodium canned vegetables. Fruits All fresh, dried, or frozen fruit. Canned fruit in natural juice (without  added sugar). Meat and other protein foods Skinless chicken or [place]. Ground chicken or [place]. Pork with fat trimmed off. Fish and seafood. Egg whites. Dried beans, peas, or lentils. Unsalted nuts, nut butters, and seeds. Unsalted canned beans. Lean cuts of beef with fat trimmed off. Low-sodium, lean deli meat. Dairy Low-fat (1%) or fat-free (skim) milk. Fat-free, low-fat, or reduced-fat cheeses. Nonfat, low-sodium ricotta or cottage cheese. Low-fat or nonfat yogurt. Low-fat, low-sodium cheese. Fats and oils Soft margarine without trans fats. Vegetable oil. Low-fat, reduced-fat, or light  mayonnaise and salad dressings (reduced-sodium). Canola, safflower, olive, soybean, and sunflower oils. Avocado. Seasoning and other foods Herbs. Spices. Seasoning mixes without salt. Unsalted popcorn and pretzels. Fat-free sweets. What foods are not recommended? The items listed may not be a complete list. Talk with your dietitian about what dietary choices are best for you. Grains Baked goods made with fat, such as croissants, muffins, or some breads. Dry pasta or rice meal packs. Vegetables Creamed or fried vegetables. Vegetables in a cheese sauce. Regular canned vegetables (not low-sodium or reduced-sodium). Regular canned tomato sauce and paste (not low-sodium or reduced-sodium). Regular tomato and vegetable juice (not low-sodium or reduced-sodium). Rosita Fire. Olives. Fruits Canned fruit in a light or heavy syrup. Fried fruit. Fruit in cream or butter sauce. Meat and other protein foods Fatty cuts of meat. Ribs. Fried meat. Tomasa Blase. Sausage. Bologna and other processed lunch meats. Salami. Fatback. Hotdogs. Bratwurst. Salted nuts and seeds. Canned beans with added salt. Canned or smoked fish. Whole eggs or egg yolks. Chicken or [place] with skin. Dairy Whole or 2% milk, cream, and half-and-half. Whole or full-fat cream cheese. Whole-fat or sweetened yogurt. Full-fat cheese. Nondairy creamers. Whipped toppings.  Processed cheese and cheese spreads. Fats and oils Butter. Stick margarine. Lard. Shortening. Ghee. Bacon fat. Tropical oils, such as coconut, palm kernel, or palm oil. Seasoning and other foods Salted popcorn and pretzels. Onion salt, garlic salt, seasoned salt, table salt, and sea salt. Worcestershire sauce. Tartar sauce. Barbecue sauce. Teriyaki sauce. Soy sauce, including reduced-sodium. Steak sauce. Canned and packaged gravies. Fish sauce. Oyster sauce. Cocktail sauce. Horseradish that you find on the shelf. Ketchup. Mustard. Meat flavorings and tenderizers. Bouillon cubes. Hot sauce and Tabasco sauce. Premade or packaged marinades. Premade or packaged taco seasonings. Relishes. Regular salad dressings. Where to find more information:  National Heart, Lung, and Blood Institute: PopSteam.is  American Heart Association: www.heart.org Summary  The DASH eating plan is a healthy eating plan that has been shown to reduce high blood pressure (hypertension). It may also reduce your risk for type 2 diabetes, heart disease, and stroke.  With the DASH eating plan, you should limit salt (sodium) intake to 2,300 mg a day. If you have hypertension, you may need to reduce your sodium intake to 1,500 mg a day.  When on the DASH eating plan, aim to eat more fresh fruits and vegetables, whole grains, lean proteins, low-fat dairy, and heart-healthy fats.  Work with your health care provider or diet and nutrition specialist (dietitian) to adjust your eating plan to your individual calorie needs. This information is not intended to replace advice given to you by your health care provider. Make sure you discuss any questions you have with your health care provider. Document Revised: 06/27/2017 Document Reviewed: 07/08/2016 Elsevier Patient Education  2020 [employer].    Exercising to Lose Weight Exercise is structured, repetitive physical activity to improve fitness and health. Getting regular  exercise is important for everyone. It is especially important if you are overweight. Being overweight increases your risk of heart disease, stroke, diabetes, high blood pressure, and several types of cancer. Reducing your calorie intake and exercising can help you lose weight. Exercise is usually categorized as moderate or vigorous intensity. To lose weight, most people need to do a certain amount of moderate-intensity or vigorous-intensity exercise each week. Moderate-intensity exercise  Moderate-intensity exercise is any activity that gets you moving enough to burn at least three times more energy (calories) than if you were sitting. Examples of moderate exercise include:  Walking a mile in 15 minutes.  Doing light yard work.  Biking at an easy pace. Most people should get at least 150 minutes (2 hours and 30 minutes) a week of moderate-intensity exercise to maintain their body weight. Vigorous-intensity exercise Vigorous-intensity exercise is any activity that gets you moving enough to burn at least six times more calories than if you were sitting. When you exercise at this intensity, you should be working hard enough that you are not able to carry on a conversation. Examples of vigorous exercise include:  Running.  Playing a team sport, such as football, basketball, and soccer.  Jumping rope. Most people should get at least 75 minutes (1 hour and 15 minutes) a week of vigorous-intensity exercise to maintain their body weight. How can exercise affect me? When you exercise enough to burn more calories than you eat, you lose weight. Exercise also reduces body fat and builds muscle. The more muscle you have, the more calories you burn. Exercise also:  Improves mood.  Reduces stress and tension.  Improves your overall fitness, flexibility, and endurance.  Increases bone strength. The amount of exercise you need to lose weight depends on:  Your age.  The type of exercise.  Any  health conditions you have.  Your overall physical ability. Talk to your health care provider about how much exercise you need and what types of activities are safe for you. What actions can I take to lose weight? Nutrition   Make changes to your diet as told by your health care provider or diet and nutrition specialist (dietitian). This may include: ? Eating fewer calories. ? Eating more protein. ? Eating less unhealthy fats. ? Eating a diet that includes fresh fruits and vegetables, whole grains, low-fat dairy products, and lean protein. ? Avoiding foods with added fat, salt, and sugar.  Drink plenty of water while you exercise to prevent dehydration or heat stroke. Activity  Choose an activity that you enjoy and set realistic goals. Your health care provider can help you make an exercise plan that works for you.  Exercise at a moderate or vigorous intensity most days of the week. ? The intensity of exercise may vary from person to person. You can tell how intense a workout is for you by paying attention to your breathing and heartbeat. Most people will notice their breathing and heartbeat get faster with more intense exercise.  Do resistance training twice each week, such as: ? Push-ups. ? Sit-ups. ? Lifting weights. ? Using resistance bands.  Getting short amounts of exercise can be just as helpful as long structured periods of exercise. If you have trouble finding time to exercise, try to include exercise in your daily routine. ? Get up, stretch, and walk around every 30 minutes throughout the day. ? Go for a walk during your lunch break. ? Park your car farther away from your destination. ? If you take public transportation, get off one stop early and walk the rest of the way. ? Make phone calls while standing up and walking around. ? Take the stairs instead of elevators or escalators.  Wear comfortable clothes and shoes with good support.  Do not exercise so much that  you hurt yourself, feel dizzy, or get very short of breath. Where to find more information  U.S. Department of Health and Human Services: ThisPath.fi  Centers for Disease Control and Prevention (CDC): FootballExhibition.com.br Contact a health care provider:  Before starting a new exercise program.  If you have questions  or concerns about your weight.  If you have a medical problem that keeps you from exercising. Get help right away if you have any of the following while exercising:  Injury.  Dizziness.  Difficulty breathing or shortness of breath that does not go away when you stop exercising.  Chest pain.  Rapid heartbeat. Summary  Being overweight increases your risk of heart disease, stroke, diabetes, high blood pressure, and several types of cancer.  Losing weight happens when you burn more calories than you eat.  Reducing the amount of calories you eat in addition to getting regular moderate or vigorous exercise each week helps you lose weight. This information is not intended to replace advice given to you by your health care provider. Make sure you discuss any questions you have with your health care provider. Document Revised: 07/28/2017 Document Reviewed: 07/28/2017 Elsevier Patient Education  2020 [employer].

## 2020-05-02 ENCOUNTER — Telehealth: Payer: Self-pay

## 2020-05-02 DIAGNOSIS — N200 Calculus of kidney: Secondary | ICD-10-CM

## 2020-05-02 NOTE — Telephone Encounter (Signed)
-----   Message from Sondra Come, MD sent at 05/02/2020  8:06 AM EDT ----- Regarding: KUB OK to cancel CT and schedule with me within the next 1-2 months with Kub prior, thanks  Legrand Rams, MD 05/02/2020  ----- Message ----- From: McLean-Scocuzza, Pasty Spillers, MD Sent: 04/28/2020   3:49 PM EDT To: Sondra Come, MD  Can you order KUB for kidney stones pt passed kidney stone 3 weeks ago h/o recurrently and other in the summer Wants KUB instead of CT scan  Can you order this for patient and contact him to be done instead of CT scan please   Thanks TMS

## 2020-05-02 NOTE — Telephone Encounter (Signed)
Called pt informed him of the information below. Pt gave verbal understanding. KUB ordered, appt scheduled. CT cancelled.

## 2020-06-07 ENCOUNTER — Ambulatory Visit
Admission: RE | Admit: 2020-06-07 | Discharge: 2020-06-07 | Disposition: A | Discharge status: Home / Self Care | Payer: 59 | Source: Ambulatory Visit | Attending: Urology | Admitting: Urology

## 2020-06-07 ENCOUNTER — Other Ambulatory Visit: Payer: Self-pay

## 2020-06-07 ENCOUNTER — Ambulatory Visit (INDEPENDENT_AMBULATORY_CARE_PROVIDER_SITE_OTHER): Payer: 59 | Admitting: Urology

## 2020-06-07 ENCOUNTER — Encounter: Payer: Self-pay | Admitting: Urology

## 2020-06-07 VITALS — BP 134/85 | HR 90 | Ht 72.0 in | Wt 235.0 lb

## 2020-06-07 DIAGNOSIS — R1012 Left upper quadrant pain: Secondary | ICD-10-CM

## 2020-06-07 DIAGNOSIS — N200 Calculus of kidney: Secondary | ICD-10-CM

## 2020-06-07 DIAGNOSIS — N201 Calculus of ureter: Secondary | ICD-10-CM | POA: Diagnosis not present

## 2020-06-07 NOTE — Progress Notes (Signed)
   06/07/2020 4:51 PM   Adam Barron 06/15/1976 [id number]  Reason for visit: Follow up nephrolithiasis  HPI: I saw Adam Barron in urology clinic for follow-up of nephrolithiasis.  Briefly Adam Barron is a 44-year-old male with a long history of kidney stones, including lithotripsy in 2013.  I saw him back in June 2021 when Adam Barron was having intermittent left-sided flank pain, and I recommended a CT stone protocol.  Adam Barron reportedly felt like Adam Barron passed a small stone and Adam Barron pain improved somewhat, so Adam Barron never had the CT done.  Today, Adam Barron reports Adam Barron has had intermittent severe left-sided flank pain over the last few months, but no urinary symptoms or hematuria.  Adam Barron denies any fevers or chills.  I personally reviewed Adam Barron KUB today that shows a large 1 cm calcification overlying the projected location of the UPJ.  Urinalysis today is benign with 0-5 WBCs, 0-2 RBCs, few bacteria, nitrite negative, no leukocytes.  I recommended obtaining a stat CT stone protocol today for further evaluation and Adam Barron was amenable.  CT was obtained and showed a large 1 cm left renal pelvis stone just above the UPJ with some mild hydronephrosis on the left side.  Stone clearly seen on KUB, 1000HU, 11 cm skin to stone distance.  We discussed various treatment options for urolithiasis including observation with or without medical expulsive therapy, shockwave lithotripsy (SWL), ureteroscopy and laser lithotripsy with stent placement, and percutaneous nephrolithotomy.  We discussed that management is based on stone size, location, density, patient co-morbidities, and patient preference.   Stones <5mm in size have a >80% spontaneous passage rate. Data surrounding the use of tamsulosin for medical expulsive therapy is controversial, but meta analyses suggests it is most efficacious for distal stones between 5-10mm in size. Possible side effects include dizziness/lightheadedness, and retrograde ejaculation.  SWL has a lower stone free rate in  a single procedure, but also a lower complication rate compared to ureteroscopy and avoids a stent and associated stent related symptoms. Possible complications include renal hematoma, steinstrasse, and need for additional treatment.  Ureteroscopy with laser lithotripsy and stent placement has a higher stone free rate than SWL in a single procedure, however increased complication rate including possible infection, ureteral injury, bleeding, and stent related morbidity. Common stent related symptoms include dysuria, urgency/frequency, and flank pain.  Adam Barron is adamantly opposed to ureteroscopy, as Adam Barron is father has had ureteral stents with significant complications and pain and side effects.  Adam Barron is amenable to proceeding with shockwave lithotripsy tomorrow for Adam Barron left-sided obstructing stone.  Armen Waring C Lilit Cinelli, MD  [facility] [address], [address] [place],  [address] [phone]   

## 2020-06-07 NOTE — H&P (View-Only) (Signed)
   06/07/2020 4:51 PM   Adam Barron January 19, 1976 [id number]  Reason for visit: Follow up nephrolithiasis  HPI: I saw Adam Barron in urology clinic for follow-up of nephrolithiasis.  Briefly he is a 44 year old male with a long history of kidney stones, including lithotripsy in 2013.  I saw him back in June 2021 when he was having intermittent left-sided flank pain, and I recommended a CT stone protocol.  He reportedly felt like he passed a small stone and his pain improved somewhat, so he never had the CT done.  Today, he reports he has had intermittent severe left-sided flank pain over the last few months, but no urinary symptoms or hematuria.  He denies any fevers or chills.  I personally reviewed his KUB today that shows a large 1 cm calcification overlying the projected location of the UPJ.  Urinalysis today is benign with 0-5 WBCs, 0-2 RBCs, few bacteria, nitrite negative, no leukocytes.  I recommended obtaining a stat CT stone protocol today for further evaluation and he was amenable.  CT was obtained and showed a large 1 cm left renal pelvis stone just above the UPJ with some mild hydronephrosis on the left side.  Stone clearly seen on KUB, 1000HU, 11 cm skin to stone distance.  We discussed various treatment options for urolithiasis including observation with or without medical expulsive therapy, shockwave lithotripsy (SWL), ureteroscopy and laser lithotripsy with stent placement, and percutaneous nephrolithotomy.  We discussed that management is based on stone size, location, density, patient co-morbidities, and patient preference.   Stones <2mm in size have a >80% spontaneous passage rate. Data surrounding the use of tamsulosin for medical expulsive therapy is controversial, but meta analyses suggests it is most efficacious for distal stones between 5-51mm in size. Possible side effects include dizziness/lightheadedness, and retrograde ejaculation.  SWL has a lower stone free rate in  a single procedure, but also a lower complication rate compared to ureteroscopy and avoids a stent and associated stent related symptoms. Possible complications include renal hematoma, steinstrasse, and need for additional treatment.  Ureteroscopy with laser lithotripsy and stent placement has a higher stone free rate than SWL in a single procedure, however increased complication rate including possible infection, ureteral injury, bleeding, and stent related morbidity. Common stent related symptoms include dysuria, urgency/frequency, and flank pain.  He is adamantly opposed to ureteroscopy, as he is father has had ureteral stents with significant complications and pain and side effects.  He is amenable to proceeding with shockwave lithotripsy tomorrow for his left-sided obstructing stone.  Sondra Come, MD  [facility] [address], [address] [place], [place] [address] [phone]

## 2020-06-07 NOTE — Patient Instructions (Addendum)
Lithotripsy  Lithotripsy is a treatment that can sometimes help eliminate kidney stones and the pain that they cause. A form of lithotripsy, also known as extracorporeal shock wave lithotripsy, is a nonsurgical procedure that crushes a kidney stone with shock waves. These shock waves pass through your body and focus on the kidney stone. They cause the kidney stone to break up while it is still in the urinary tract. This makes it easier for the smaller pieces of stone to pass in the urine. Tell a health care provider about:  Any allergies you have.  All medicines you are taking, including vitamins, herbs, eye drops, creams, and over-the-counter medicines.  Any blood disorders you have.  Any surgeries you have had.  Any medical conditions you have.  Whether you are pregnant or may be pregnant.  Any problems you or family members have had with anesthetic medicines. What are the risks? Generally, this is a safe procedure. However, problems may occur, including:  Infection.  Bleeding of the kidney.  Bruising of the kidney or skin.  Scarring of the kidney, which can lead to: ? Increased blood pressure. ? Poor kidney function. ? Return (recurrence) of kidney stones.  Damage to other structures or organs, such as the liver, colon, spleen, or pancreas.  Blockage (obstruction) of the the tube that carries urine from the kidney to the bladder (ureter).  Failure of the kidney stone to break into pieces (fragments). What happens before the procedure? Staying hydrated Follow instructions from your health care provider about hydration, which may include:  Up to 2 hours before the procedure - you may continue to drink clear liquids, such as water, clear fruit juice, black coffee, and plain tea. Eating and drinking restrictions Follow instructions from your health care provider about eating and drinking, which may include:  8 hours before the procedure - stop eating heavy meals or foods  such as meat, fried foods, or fatty foods.  6 hours before the procedure - stop eating light meals or foods, such as toast or cereal.  6 hours before the procedure - stop drinking milk or drinks that contain milk.  2 hours before the procedure - stop drinking clear liquids. General instructions  Plan to have someone take you home from the hospital or clinic.  Ask your health care provider about: ? Changing or stopping your regular medicines. This is especially important if you are taking diabetes medicines or blood thinners. ? Taking medicines such as aspirin and ibuprofen. These medicines and other NSAIDs can thin your blood. Do not take these medicines for 7 days before your procedure if your health care provider instructs you not to.  You may have tests, such as: ? Blood tests. ? Urine tests. ? Imaging tests, such as a CT scan. What happens during the procedure?  To lower your risk of infection: ? Your health care team will wash or sanitize their hands. ? Your skin will be washed with soap.  An IV tube will be inserted into one of your veins. This tube will give you fluids and medicines.  You will be given one or more of the following: ? A medicine to help you relax (sedative). ? A medicine to make you fall asleep (general anesthetic).  A water-filled cushion may be placed behind your kidney or on your abdomen. In some cases you may be placed in a tub of lukewarm water.  Your body will be positioned in a way that makes it easy to target the kidney   stone.  A flexible tube with holes in it (stent) may be placed in the ureter. This will help keep urine flowing from the kidney if the fragments of the stone have been blocking the ureter.  An X-ray or ultrasound exam will be done to locate your stone.  Shock waves will be aimed at the stone. If you are awake, you may feel a tapping sensation as the shock waves pass through your body. The procedure may vary among health care  providers and hospitals. What happens after the procedure?  You may have an X-ray to see whether the procedure was able to break up the kidney stone and how much of the stone has passed. If large stone fragments remain after treatment, you may need to have a second procedure at a later time.  Your blood pressure, heart rate, breathing rate, and blood oxygen level will be monitored until the medicines you were given have worn off.  You may be given antibiotics or pain medicine as needed.  If a stent was placed in your ureter during surgery, it may stay in place for a few weeks.  You may need strain your urine to collect pieces of the kidney stone for testing.  You will need to drink plenty of water.  Do not drive for 24 hours if you were given a sedative. Summary  Lithotripsy is a treatment that can sometimes help eliminate kidney stones and the pain that they cause.  A form of lithotripsy, also known as extracorporeal shock wave lithotripsy, is a nonsurgical procedure that crushes a kidney stone with shock waves.  Generally, this is a safe procedure. However, problems may occur, including damage to the kidney or other organs, infection, or obstruction of the tube that carries urine from the kidney to the bladder (ureter).  When you go home, you will need to drink plenty of water. You may be asked to strain your urine to collect pieces of the kidney stone for testing. This information is not intended to replace advice given to you by your health care provider. Make sure you discuss any questions you have with your health care provider. Document Revised: 10/26/2018 Document Reviewed: 06/05/2016 Elsevier Patient Education  2020 [employer]vier [employer].  

## 2020-06-08 ENCOUNTER — Encounter: Admission: RE | Payer: Self-pay | Source: Home / Self Care

## 2020-06-08 ENCOUNTER — Ambulatory Visit
Admission: RE | Admit: 2020-06-08 | Discharge: 2020-06-08 | Disposition: A | Discharge status: Home / Self Care | Payer: 59 | Attending: Urology | Admitting: Urology

## 2020-06-08 ENCOUNTER — Encounter: Payer: Self-pay | Admitting: Urology

## 2020-06-08 ENCOUNTER — Other Ambulatory Visit: Payer: Self-pay

## 2020-06-08 ENCOUNTER — Encounter
Admission: RE | Disposition: A | Discharge status: Home / Self Care | Payer: Self-pay | Source: Home / Self Care | Attending: Urology

## 2020-06-08 ENCOUNTER — Ambulatory Visit: Admission: RE | Admit: 2020-06-08 | Payer: 59 | Source: Home / Self Care | Admitting: Urology

## 2020-06-08 DIAGNOSIS — Z6831 Body mass index (BMI) 31.0-31.9, adult: Secondary | ICD-10-CM | POA: Diagnosis not present

## 2020-06-08 DIAGNOSIS — N132 Hydronephrosis with renal and ureteral calculous obstruction: Secondary | ICD-10-CM | POA: Diagnosis not present

## 2020-06-08 DIAGNOSIS — E669 Obesity, unspecified: Secondary | ICD-10-CM | POA: Diagnosis not present

## 2020-06-08 DIAGNOSIS — Z87442 Personal history of urinary calculi: Secondary | ICD-10-CM | POA: Diagnosis not present

## 2020-06-08 HISTORY — DX: Personal history of urinary calculi: Z87.442

## 2020-06-08 HISTORY — PX: EXTRACORPOREAL SHOCK WAVE LITHOTRIPSY: SHX1557

## 2020-06-08 LAB — URINALYSIS, COMPLETE
Bilirubin, UA: NEGATIVE
Glucose, UA: NEGATIVE
Ketones, UA: NEGATIVE
Leukocytes,UA: NEGATIVE
Nitrite, UA: NEGATIVE
Protein,UA: NEGATIVE
Specific Gravity, UA: 1.01 (ref 1.005–1.030)
Urobilinogen, Ur: 2 mg/dL — ABNORMAL HIGH (ref 0.2–1.0)
pH, UA: 7 (ref 5.0–7.5)

## 2020-06-08 LAB — MICROSCOPIC EXAMINATION

## 2020-06-08 SURGERY — LITHOTRIPSY, ESWL
Anesthesia: Moderate Sedation | Laterality: Left

## 2020-06-08 MED ORDER — DIAZEPAM 5 MG PO TABS
ORAL_TABLET | ORAL | Status: AC
  Filled 2020-06-08: qty 2

## 2020-06-08 MED ORDER — DIPHENHYDRAMINE HCL 25 MG PO CAPS
25.0000 mg | ORAL_CAPSULE | Freq: Once | ORAL | Status: AC
  Administered 2020-06-08: 25 mg via ORAL

## 2020-06-08 MED ORDER — HYDROCODONE-ACETAMINOPHEN 5-325 MG PO TABS: 1.0000 | ORAL_TABLET | Freq: Four times a day (QID) | ORAL | 0 refills | Status: AC | PRN

## 2020-06-08 MED ORDER — DIPHENHYDRAMINE HCL 25 MG PO CAPS
ORAL_CAPSULE | ORAL | Status: AC
  Filled 2020-06-08: qty 1

## 2020-06-08 MED ORDER — SODIUM CHLORIDE 0.9 % IV SOLN
INTRAVENOUS | Status: DC
  Administered 2020-06-08: 100 mL/h via INTRAVENOUS

## 2020-06-08 MED ORDER — ONDANSETRON HCL 4 MG/2ML IJ SOLN: 4.0000 mg | Freq: Once | INTRAMUSCULAR | Status: AC

## 2020-06-08 MED ORDER — DIAZEPAM 5 MG PO TABS
10.0000 mg | ORAL_TABLET | Freq: Once | ORAL | Status: AC
  Administered 2020-06-08: 10 mg via ORAL

## 2020-06-08 MED ORDER — ONDANSETRON HCL 4 MG/2ML IJ SOLN
INTRAMUSCULAR | Status: AC
  Administered 2020-06-08: 4 mg via INTRAVENOUS
  Filled 2020-06-08: qty 2

## 2020-06-08 MED ORDER — CIPROFLOXACIN HCL 500 MG PO TABS
500.0000 mg | ORAL_TABLET | Freq: Once | ORAL | Status: AC
  Administered 2020-06-08: 500 mg via ORAL

## 2020-06-08 MED ORDER — TAMSULOSIN HCL 0.4 MG PO CAPS: 0.4000 mg | ORAL_CAPSULE | Freq: Every day | ORAL | 0 refills | Status: DC

## 2020-06-08 MED ORDER — CIPROFLOXACIN HCL 500 MG PO TABS
ORAL_TABLET | ORAL | Status: AC
  Filled 2020-06-08: qty 1

## 2020-06-08 NOTE — Discharge Instructions (Signed)

## 2020-06-08 NOTE — Interval H&P Note (Signed)
UROLOGY H&P UPDATE  Agree with prior H&P dated 06/07/20. 42mm left UPJ stone with mild hydronephrosis.  Cardiac: RRR Lungs: CTA bilaterally  Laterality: LEFT Procedure: Shockwave lithotripsy  Urine: urinalysis benign 11/10  Informed consent obtained, we specifically discussed the risks of bleeding/hematoma, steinstrasse/obstructive fragments, infection, post-operative pain, need for additional procedures.  Sondra Come, MD 06/08/2020

## 2020-06-08 NOTE — Brief Op Note (Signed)
06/08/2020  10:27 AM  PATIENT:  Adam Barron  44 y.o. male  PRE-OPERATIVE DIAGNOSIS:  Left 85mm UPJ stone  POST-OPERATIVE DIAGNOSIS:  Same  PROCEDURE:  Procedure(s): EXTRACORPOREAL SHOCK WAVE LITHOTRIPSY (ESWL) (Left)  SURGEON:  Surgeon(s) and Role:    * Sondra Come, MD - Primary  ANESTHESIA: Conscious Sedation  EBL:  None  Drains: None  Specimen: None  Findings:  1. Excellent fragmentation of stone   DISPO: Flomax, pain meds PRN, RTC 2 weeks KUB  Legrand Rams, MD 06/08/2020

## 2020-06-10 LAB — CULTURE, URINE COMPREHENSIVE

## 2020-06-20 ENCOUNTER — Other Ambulatory Visit: Payer: Self-pay | Admitting: Family Medicine

## 2020-06-20 DIAGNOSIS — N200 Calculus of kidney: Secondary | ICD-10-CM

## 2020-06-20 NOTE — Progress Notes (Signed)
06/21/2020 9:17 AM   Adam Barron Dec 11, 1975 [id number]  Referring provider: McLean-Scocuzza, Pasty Spillers, MD [address] [place],  [place] [address]  Chief Complaint  Patient presents with  . Nephrolithiasis    HPI: Adam Barron is a 44 y.o. who is status post ESWL who presents today for follow up.  Underwent ESWL on 06/08/2020 for a left 8 mm UPJ with Dr. Richardo Hanks.  Their postprocedural course was as expected and uneventful.   They have passed fragments.    They did not bring in fragments for analysis as it was only sand.   KUB 06/21/2020 left UPJ stone no longer visible.     PMH: Past Medical History:  Diagnosis Date  . Chicken pox   . History of kidney stones    several lithotripsy  . Nephrolithiasis    kidney stones 2011   . Viral encephalitis    MRI/V neg 12/01/09     Surgical History: Past Surgical History:  Procedure Laterality Date  . EXTRACORPOREAL SHOCK WAVE LITHOTRIPSY  2013  . EXTRACORPOREAL SHOCK WAVE LITHOTRIPSY Left 06/08/2020   Procedure: EXTRACORPOREAL SHOCK WAVE LITHOTRIPSY (ESWL);  Surgeon: Sondra Come, MD;  Location: [facility] ORS;  Service: Urology;  Laterality: Left;  Marland Kitchen VASECTOMY     since left >right testicles since   . WISDOM TOOTH EXTRACTION    . WISDOM TOOTH EXTRACTION      Home Medications:  No current outpatient medications on file prior to visit.   No current facility-administered medications on file prior to visit.    Allergies:  Allergies  Allergen Reactions  . Penicillin G Rash    Family History: Family History  Problem Relation Age of Onset  . Kidney Stones Father   . Colon polyps Father        intestine removed late 11s early 108s   . Diabetes Mother   . Hypertension Mother   . Colon polyps Sister   . Alcohol abuse Maternal Grandfather   . Cancer Maternal Grandfather        MM  . Drug abuse Maternal Grandfather   . Heart disease Maternal Grandfather   . Bladder Cancer Neg Hx   . Prostate cancer Neg Hx      Social History:  reports that he quit smoking about 11 years ago. His smokeless tobacco use includes chew. He reports current alcohol use. He reports that he does not use drugs.  ROS: Pertinent ROS in HPI  Physical Exam: BP 138/79   Pulse 83   Ht 6' (1.829 m)   Wt 235 lb (106.6 kg)   BMI 31.87 kg/m   Constitutional:  Well nourished. Alert and oriented, No acute distress. HEENT: [place] AT, mask in place.   Trachea midline. Cardiovascular: No clubbing, cyanosis, or edema. Respiratory: Normal respiratory effort, no increased work of breathing. Neurologic: Grossly intact, no focal deficits, moving all 4 extremities. Psychiatric: Normal mood and affect.  Laboratory Data: Lab Results  Component Value Date   WBC 6.6 03/28/2020   HGB 15.2 03/28/2020   HCT 42.9 03/28/2020   MCV 87.7 03/28/2020   PLT 175.0 03/28/2020    Lab Results  Component Value Date   CREATININE 1.29 03/28/2020       Component Value Date/Time   CHOL 151 03/28/2020 0802   HDL 31.50 (L) 03/28/2020 0802   CHOLHDL 5 03/28/2020 0802   VLDL 13.2 03/28/2020 0802   LDLCALC 106 (H) 03/28/2020 0802    Urinalysis    Component Value Date/Time  COLORURINE DARK YELLOW 03/28/2020 0803   APPEARANCEUR Hazy (A) 06/07/2020 1602   LABSPEC 1.021 03/28/2020 0803   LABSPEC 1.024 01/12/2014 0902   PHURINE 6.5 03/28/2020 0803   GLUCOSEU Negative 06/07/2020 1602   GLUCOSEU NEGATIVE 02/10/2018 0800   HGBUR NEGATIVE 03/28/2020 0803   BILIRUBINUR Negative 06/07/2020 1602   BILIRUBINUR Negative 01/12/2014 0902   KETONESUR TRACE (A) 03/28/2020 0803   PROTEINUR Negative 06/07/2020 1602   PROTEINUR NEGATIVE 03/28/2020 0803   UROBILINOGEN 1.0 02/10/2018 0800   NITRITE Negative 06/07/2020 1602   NITRITE NEGATIVE 03/28/2020 0803   LEUKOCYTESUR Negative 06/07/2020 1602   LEUKOCYTESUR NEGATIVE 03/28/2020 0803   LEUKOCYTESUR Negative 01/12/2014 0902  I have reviewed the labs.   Pertinent Imaging: CLINICAL DATA:   Nephrolithiasis  EXAM: ABDOMEN - 1 VIEW  COMPARISON:  June 07, 2020 abdominal radiograph and CT abdomen and pelvis June 07, 2020  FINDINGS: The previously noted calculus at the level of the left renal pelvis is no longer evident. There is a 3 mm probable phlebolith in the lower left pelvis. No other abnormal calcification evident currently. There is moderate stool in the colon. There is no bowel dilatation or air-fluid level to suggest bowel obstruction. No free air. Lung bases clear.  IMPRESSION: Previous calculus at the level of the left renal pelvis no longer evident. Probable small phlebolith lower left pelvis. No other abnormal calcification. No bowel obstruction or free air evident.   Electronically Signed   By: Bretta Bang III M.D.   On: 06/21/2020 14:33 I have independently reviewed the films.  See HPI.   Assessment & Plan:    1. Left ureteral stone No longer visible on today's KUB Patient states that he believes his stone composition is calcium something and he did have a metabolic work-up in the past which he did not find helpful He is not interested in pursuing another metabolic work-up He states that he is going to increase his water intake and only drink ginger ale unless he needs caffeine for which he will drink Pepsi and see if this will help prevent further stones in the future He does not desire any further follow-up at this time   Return if symptoms worsen or fail to improve.  These notes generated with voice recognition software. I apologize for typographical errors.  Michiel Cowboy, PA-C  [facility] [address]  [address] [place], [place] [address] [phone]

## 2020-06-21 ENCOUNTER — Ambulatory Visit
Admission: RE | Admit: 2020-06-21 | Discharge: 2020-06-21 | Disposition: A | Discharge status: Home / Self Care | Payer: 59 | Attending: Urology | Admitting: Urology

## 2020-06-21 ENCOUNTER — Other Ambulatory Visit: Payer: Self-pay

## 2020-06-21 ENCOUNTER — Encounter: Payer: Self-pay | Admitting: Urology

## 2020-06-21 ENCOUNTER — Ambulatory Visit (INDEPENDENT_AMBULATORY_CARE_PROVIDER_SITE_OTHER): Payer: 59 | Admitting: Urology

## 2020-06-21 ENCOUNTER — Ambulatory Visit
Admission: RE | Admit: 2020-06-21 | Discharge: 2020-06-21 | Disposition: A | Discharge status: Home / Self Care | Payer: 59 | Source: Ambulatory Visit | Attending: Urology | Admitting: Urology

## 2020-06-21 VITALS — BP 138/79 | HR 83 | Ht 72.0 in | Wt 235.0 lb

## 2020-06-21 DIAGNOSIS — N201 Calculus of ureter: Secondary | ICD-10-CM

## 2020-06-21 DIAGNOSIS — N200 Calculus of kidney: Secondary | ICD-10-CM | POA: Diagnosis not present

## 2021-04-24 ENCOUNTER — Other Ambulatory Visit: Payer: Self-pay | Admitting: Family

## 2021-04-24 DIAGNOSIS — Z0000 Encounter for general adult medical examination without abnormal findings: Secondary | ICD-10-CM

## 2021-05-04 ENCOUNTER — Encounter: Payer: 59 | Admitting: Internal Medicine

## 2021-05-25 ENCOUNTER — Encounter: Payer: 59 | Admitting: Internal Medicine

## 2021-05-25 ENCOUNTER — Other Ambulatory Visit: Payer: Self-pay

## 2021-08-02 IMAGING — CT CT RENAL STONE PROTOCOL
2 of 4 series · 16 of 46 positions shown, 18 images · non-contrast
Comparison: June 09, 2010

CLINICAL DATA: Left-sided flank pain

EXAM:
CT ABDOMEN AND PELVIS WITHOUT CONTRAST
TECHNIQUE: Multidetector CT imaging of the abdomen and pelvis was performed
following the standard protocol without IV contrast.

[Series 2: stone full standard · axial · 0.82mm/px · z∈[-546,-66]mm · 13 of 106 slices shown, 15 images]
[im 5/106  soft-tissue]
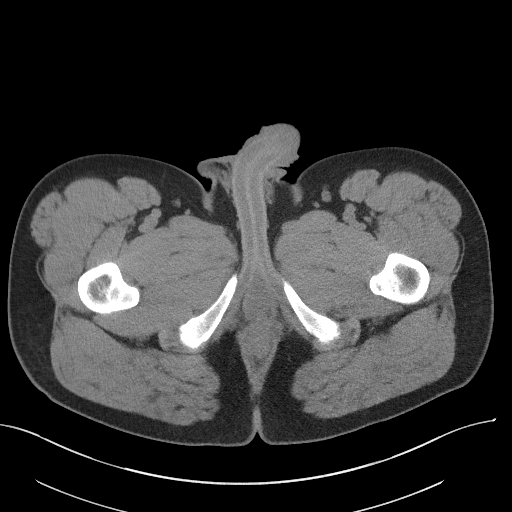
[im 5/106  bone]
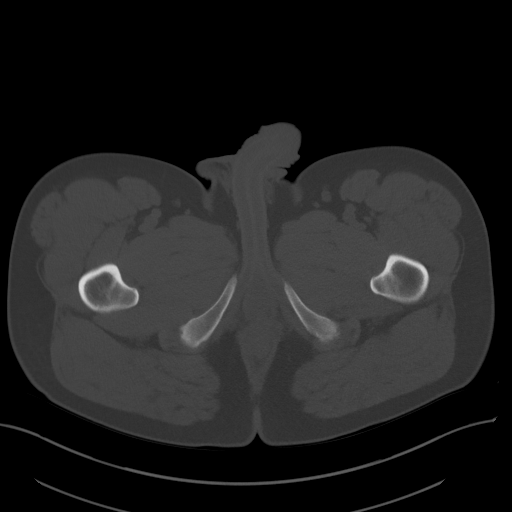
[im 14/106  soft-tissue]
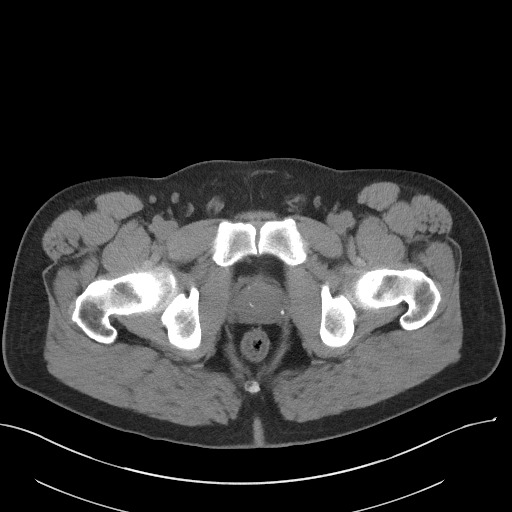
[im 22/106  soft-tissue]
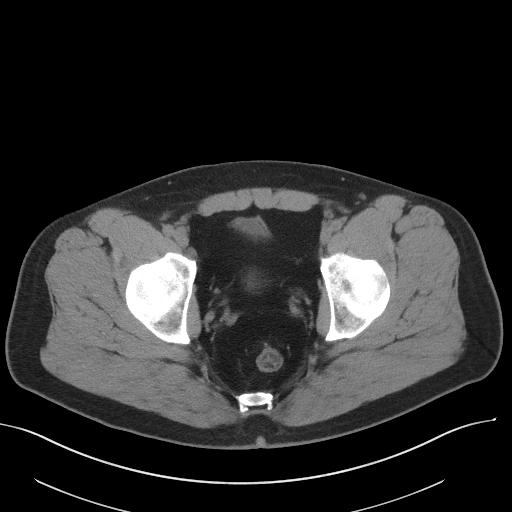
[im 31/106  soft-tissue]
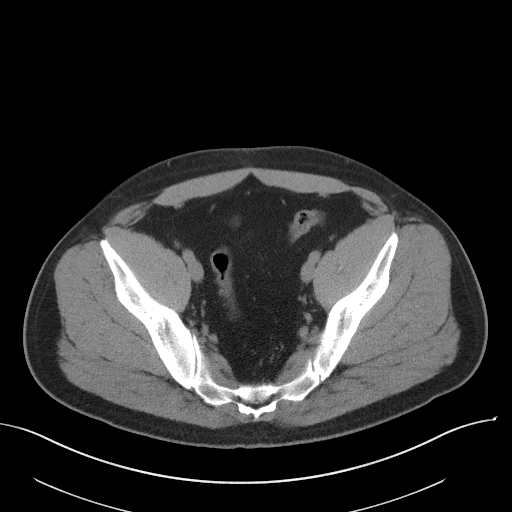
[im 36/106  soft-tissue]
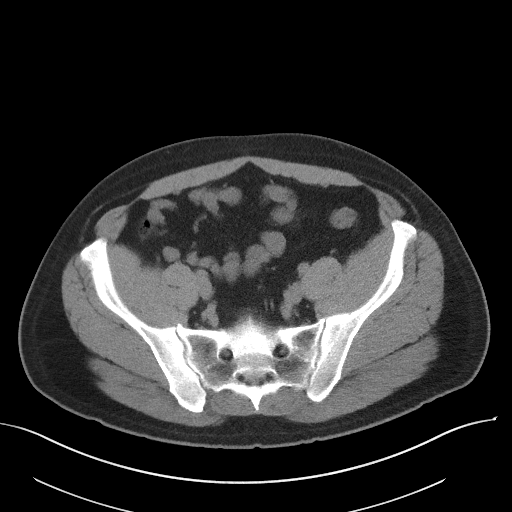
[im 44/106  soft-tissue]
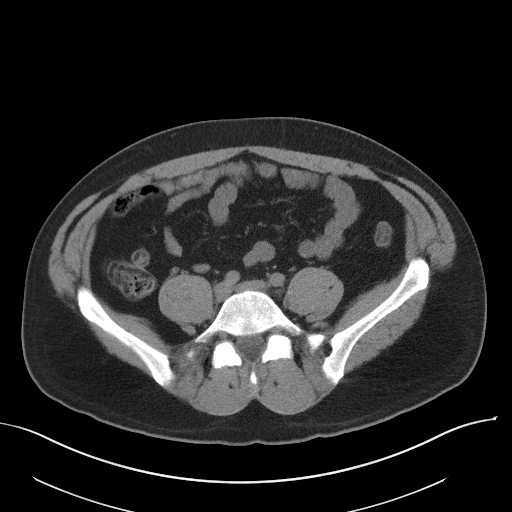
[im 53/106  soft-tissue]
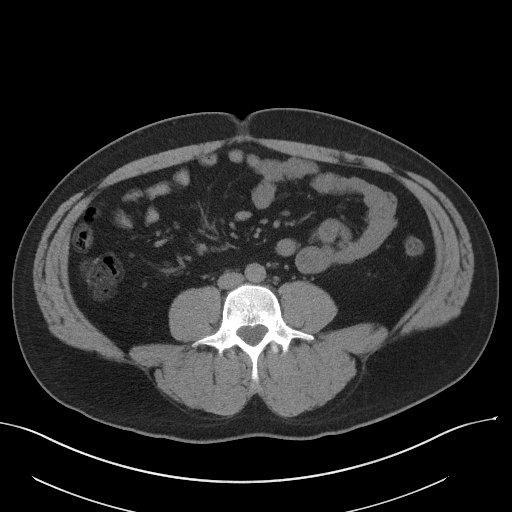
[im 62/106  soft-tissue]
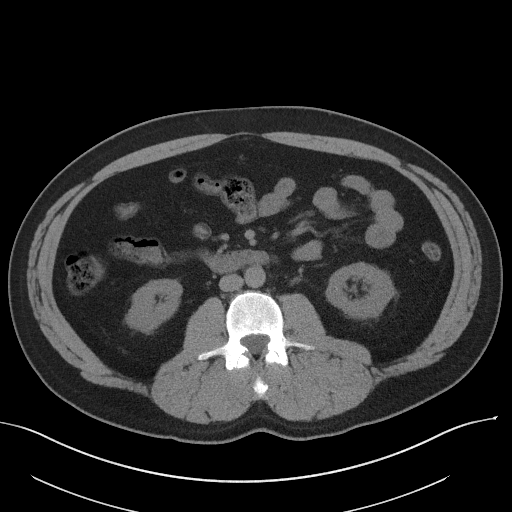
[im 71/106  soft-tissue]
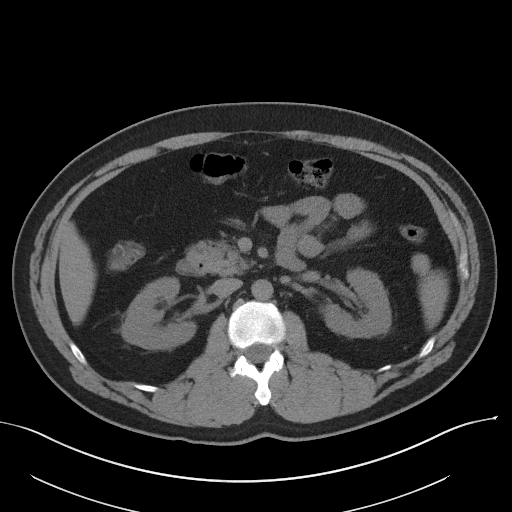
[im 71/106  bone]
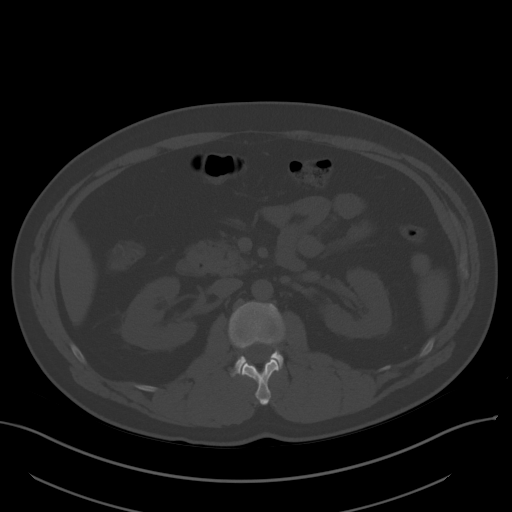
[im 75/106  soft-tissue]
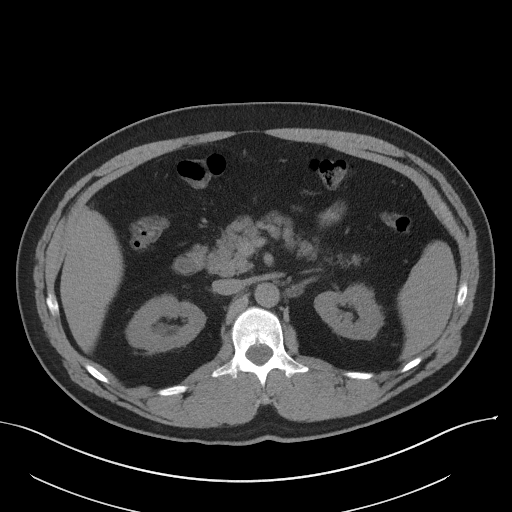
[im 84/106  soft-tissue]
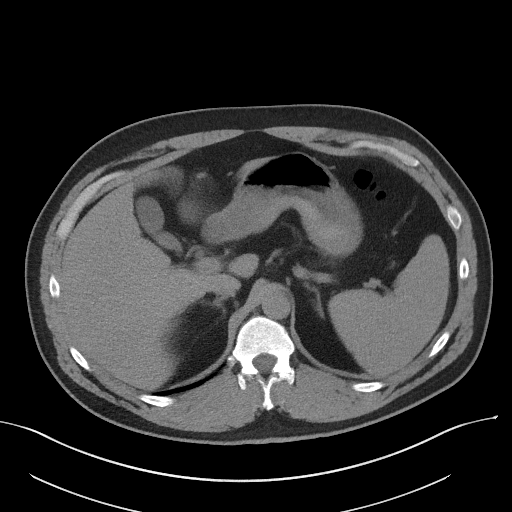
[im 92/106  soft-tissue]
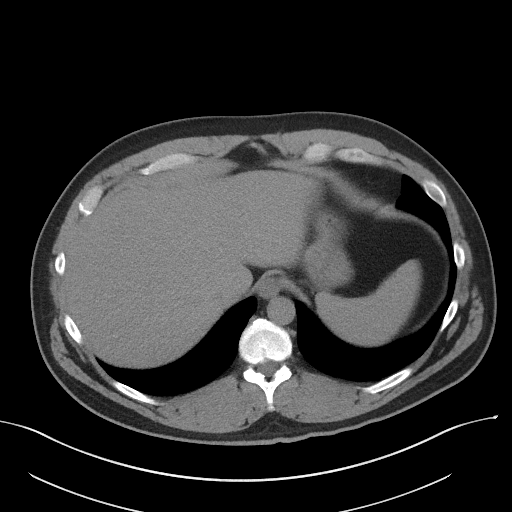
[im 101/106  soft-tissue]
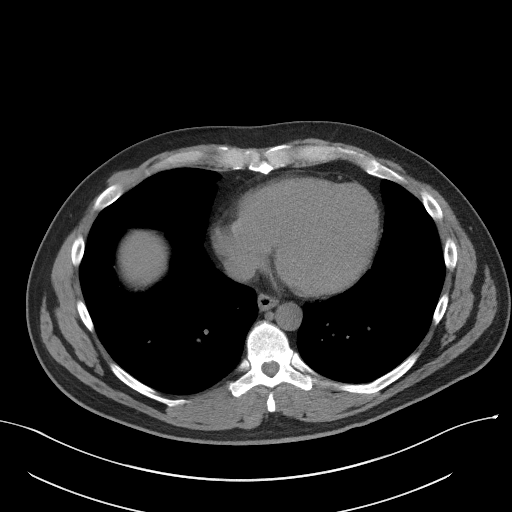

[Series 5: coronal · coronal · 0.82mm/px · 3 of 144 slices shown]
[im 48/144  soft-tissue]
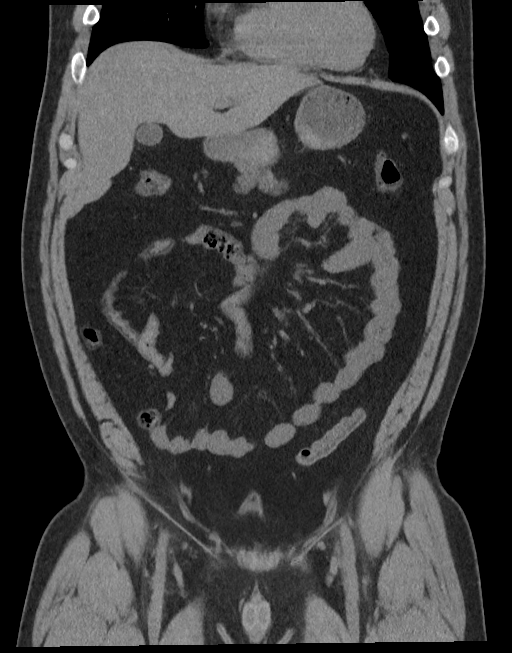
[im 64/144  soft-tissue]
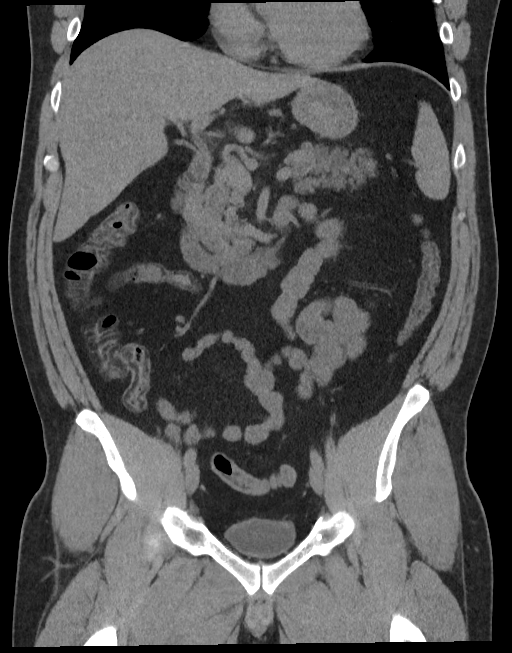
[im 80/144  soft-tissue]
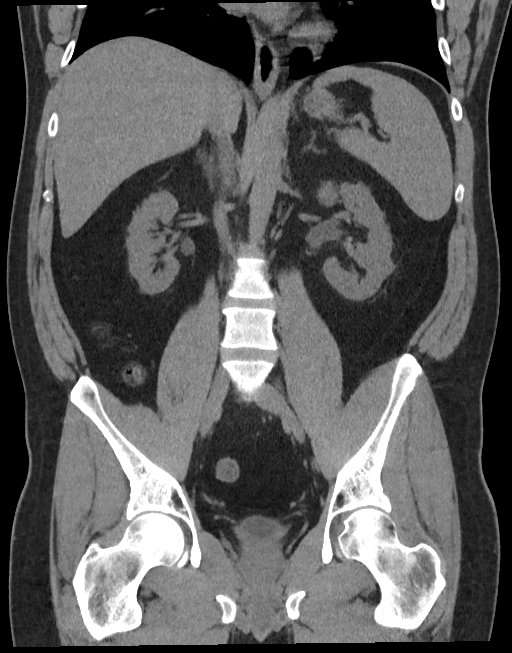

[16 of 46 positions shown; findings below may reference images not displayed]

FINDINGS: Lower chest: The visualized heart size within normal limits. No
pericardial fluid/thickening.

No hiatal hernia.

The visualized portions of the lungs are clear.

Hepatobiliary: Although limited due to the lack of intravenous
contrast, normal in appearance without gross focal abnormality. No
evidence of calcified gallstones or biliary ductal dilatation.

Pancreas:  Unremarkable.  No surrounding inflammatory changes.

Spleen: Normal in size. Although limited due to the lack of
intravenous contrast, normal in appearance.

Adrenals/Urinary Tract: Both adrenal glands appear normal. There is
mild left pelvicaliectasis with a proximal 8 mm left renal pelvic
calculi. Mild left-sided perinephric stranding is seen. There is a
punctate calcification upper pole the left kidney. No right-sided
renal or collecting system calculi are seen. The bladder is
partially decompressed.

Stomach/Bowel: The stomach, small bowel, and colon are normal in
appearance. No inflammatory changes or obstructive findings.
appendix is normal.

Vascular/Lymphatic: There are no enlarged abdominal or pelvic lymph
nodes. No significant gross vascular findings are present given the
lack of intravenous contrast.

Reproductive: The prostate is unremarkable.

Other: No evidence of abdominal wall mass or hernia.

Musculoskeletal: No acute or significant osseous findings.
IMPRESSION: Mild left hydronephrosis with a proximal left 8 mm renal pelvic
calculi. Mild left-sided perinephric stranding.

Punctate nonobstructing left renal calculi.
